# Patient Record
Sex: Female | Born: 1985
Health system: Southern US, Community
[De-identification: ages and names within clinical notes are randomized; demographics above are authoritative.]

## PROBLEM LIST (undated history)

## (undated) DIAGNOSIS — Z789 Other specified health status: Secondary | ICD-10-CM

## (undated) DIAGNOSIS — F419 Anxiety disorder, unspecified: Secondary | ICD-10-CM

## (undated) DIAGNOSIS — C801 Malignant (primary) neoplasm, unspecified: Secondary | ICD-10-CM

## (undated) HISTORY — PX: NO PAST SURGERIES: SHX2092

---

## 2008-10-29 ENCOUNTER — Ambulatory Visit (HOSPITAL_COMMUNITY): Admission: RE | Admit: 2008-10-29 | Discharge: 2008-10-29 | Payer: Self-pay | Admitting: Gynecology

## 2010-01-05 ENCOUNTER — Inpatient Hospital Stay (HOSPITAL_COMMUNITY): Admission: AD | Admit: 2010-01-05 | Discharge: 2010-01-07 | Payer: Self-pay | Admitting: Obstetrics and Gynecology

## 2010-02-22 NOTE — L&D Delivery Note (Cosign Needed)
Delivery Note At 7:12 AM a viable female was delivered via Vaginal, Spontaneous Delivery (Presentation: LOA ;  ).  APGAR: 8, 9; weight 6 lb 5.2 oz (2870 g).   Placenta status: Intact, Spontaneous.  Cord: 3 vessels with the following complications: .  none  Anesthesia: Local  Episiotomy: n/a Lacerations: 2nd degree;Perineal Suture Repair: 2.0 vicryl Est. Blood Loss (mL): 350  Mom to postpartum.  Baby to nursery-stable.  Plans to bottlefeed, unsure of contraception  Joellyn Haff, SNM 01/18/2011, 7:33 AM

## 2010-05-05 LAB — CBC
HCT: 23.8 % — ABNORMAL LOW (ref 36.0–46.0)
HCT: 32.9 % — ABNORMAL LOW (ref 36.0–46.0)
Hemoglobin: 11 g/dL — ABNORMAL LOW (ref 12.0–15.0)
Hemoglobin: 8 g/dL — ABNORMAL LOW (ref 12.0–15.0)
MCH: 26.6 pg (ref 26.0–34.0)
MCH: 26.9 pg (ref 26.0–34.0)
MCHC: 33.5 g/dL (ref 30.0–36.0)
MCHC: 33.7 g/dL (ref 30.0–36.0)
MCV: 79.4 fL (ref 78.0–100.0)
MCV: 79.7 fL (ref 78.0–100.0)
Platelets: 235 10*3/uL (ref 150–400)
Platelets: 285 10*3/uL (ref 150–400)
RBC: 2.99 MIL/uL — ABNORMAL LOW (ref 3.87–5.11)
RBC: 4.15 MIL/uL (ref 3.87–5.11)
RDW: 16.1 % — ABNORMAL HIGH (ref 11.5–15.5)
RDW: 17 % — ABNORMAL HIGH (ref 11.5–15.5)
WBC: 12.4 10*3/uL — ABNORMAL HIGH (ref 4.0–10.5)
WBC: 8.5 10*3/uL (ref 4.0–10.5)

## 2010-05-05 LAB — RPR: RPR Ser Ql: NONREACTIVE

## 2010-07-03 LAB — HIV ANTIBODY (ROUTINE TESTING W REFLEX): HIV: NONREACTIVE

## 2010-07-03 LAB — RUBELLA ANTIBODY, IGM: Rubella: IMMUNE

## 2010-07-03 LAB — ANTIBODY SCREEN: Antibody Screen: NEGATIVE

## 2010-07-03 LAB — RPR: RPR: NONREACTIVE

## 2010-07-03 LAB — HEPATITIS B SURFACE ANTIGEN: Hepatitis B Surface Ag: NEGATIVE

## 2011-01-18 ENCOUNTER — Encounter (HOSPITAL_COMMUNITY): Payer: Self-pay | Admitting: *Deleted

## 2011-01-18 ENCOUNTER — Inpatient Hospital Stay (HOSPITAL_COMMUNITY)
Admission: AD | Admit: 2011-01-18 | Discharge: 2011-01-19 | DRG: 373 | Disposition: A | Discharge status: Home / Self Care | Payer: BC Managed Care – PPO | Source: Ambulatory Visit | Attending: Obstetrics & Gynecology | Admitting: Obstetrics & Gynecology

## 2011-01-18 DIAGNOSIS — O99892 Other specified diseases and conditions complicating childbirth: Secondary | ICD-10-CM | POA: Diagnosis present

## 2011-01-18 DIAGNOSIS — Z22330 Carrier of Group B streptococcus: Secondary | ICD-10-CM

## 2011-01-18 DIAGNOSIS — O0930 Supervision of pregnancy with insufficient antenatal care, unspecified trimester: Secondary | ICD-10-CM

## 2011-01-18 HISTORY — DX: Other specified health status: Z78.9

## 2011-01-18 LAB — RPR: RPR Ser Ql: NONREACTIVE

## 2011-01-18 LAB — CBC
HCT: 33.2 % — ABNORMAL LOW (ref 36.0–46.0)
Hemoglobin: 10.5 g/dL — ABNORMAL LOW (ref 12.0–15.0)
MCH: 24.2 pg — ABNORMAL LOW (ref 26.0–34.0)
MCHC: 31.6 g/dL (ref 30.0–36.0)
MCV: 76.7 fL — ABNORMAL LOW (ref 78.0–100.0)
Platelets: 301 10*3/uL (ref 150–400)
RBC: 4.33 MIL/uL (ref 3.87–5.11)
RDW: 14.1 % (ref 11.5–15.5)
WBC: 10.3 10*3/uL (ref 4.0–10.5)

## 2011-01-18 LAB — ABO/RH: RH Type: POSITIVE

## 2011-01-18 LAB — STREP B DNA PROBE: GBS: POSITIVE

## 2011-01-18 MED ORDER — LANOLIN HYDROUS EX OINT: TOPICAL_OINTMENT | CUTANEOUS | Status: DC | PRN

## 2011-01-18 MED ORDER — WITCH HAZEL-GLYCERIN EX PADS: 1.0000 "application " | MEDICATED_PAD | CUTANEOUS | Status: DC | PRN

## 2011-01-18 MED ORDER — OXYTOCIN BOLUS FROM INFUSION
500.0000 mL | Freq: Once | INTRAVENOUS | Status: DC
  Filled 2011-01-18: qty 500
  Filled 2011-01-18: qty 1000

## 2011-01-18 MED ORDER — LACTATED RINGERS IV SOLN: 500.0000 mL | INTRAVENOUS | Status: DC | PRN

## 2011-01-18 MED ORDER — OXYCODONE-ACETAMINOPHEN 5-325 MG PO TABS
1.0000 | ORAL_TABLET | ORAL | Status: DC | PRN
  Administered 2011-01-18: 1 via ORAL
  Administered 2011-01-18: 2 via ORAL
  Administered 2011-01-18 – 2011-01-19 (×2): 1 via ORAL
  Filled 2011-01-18 (×3): qty 1
  Filled 2011-01-18: qty 2

## 2011-01-18 MED ORDER — CITRIC ACID-SODIUM CITRATE 334-500 MG/5ML PO SOLN: 30.0000 mL | ORAL | Status: DC | PRN

## 2011-01-18 MED ORDER — OXYCODONE-ACETAMINOPHEN 5-325 MG PO TABS: 2.0000 | ORAL_TABLET | ORAL | Status: DC | PRN

## 2011-01-18 MED ORDER — LIDOCAINE HCL (PF) 1 % IJ SOLN
30.0000 mL | INTRAMUSCULAR | Status: DC | PRN
  Administered 2011-01-18: 30 mL via SUBCUTANEOUS
  Filled 2011-01-18: qty 30

## 2011-01-18 MED ORDER — ACETAMINOPHEN 325 MG PO TABS: 650.0000 mg | ORAL_TABLET | ORAL | Status: DC | PRN

## 2011-01-18 MED ORDER — SENNOSIDES-DOCUSATE SODIUM 8.6-50 MG PO TABS
2.0000 | ORAL_TABLET | Freq: Every day | ORAL | Status: DC
  Administered 2011-01-18: 2 via ORAL

## 2011-01-18 MED ORDER — IBUPROFEN 600 MG PO TABS: 600.0000 mg | ORAL_TABLET | Freq: Four times a day (QID) | ORAL | Status: DC | PRN

## 2011-01-18 MED ORDER — BENZOCAINE-MENTHOL 20-0.5 % EX AERO
1.0000 "application " | INHALATION_SPRAY | CUTANEOUS | Status: DC | PRN
  Filled 2011-01-18: qty 56

## 2011-01-18 MED ORDER — DIBUCAINE 1 % RE OINT
1.0000 "application " | TOPICAL_OINTMENT | RECTAL | Status: DC | PRN
  Filled 2011-01-18: qty 28

## 2011-01-18 MED ORDER — LACTATED RINGERS IV SOLN: INTRAVENOUS | Status: DC

## 2011-01-18 MED ORDER — SIMETHICONE 80 MG PO CHEW: 80.0000 mg | CHEWABLE_TABLET | ORAL | Status: DC | PRN

## 2011-01-18 MED ORDER — ONDANSETRON HCL 4 MG/2ML IJ SOLN: 4.0000 mg | INTRAMUSCULAR | Status: DC | PRN

## 2011-01-18 MED ORDER — NALBUPHINE HCL 10 MG/ML IJ SOLN
10.0000 mg | INTRAMUSCULAR | Status: DC | PRN
  Administered 2011-01-18: 10 mg via INTRAVENOUS

## 2011-01-18 MED ORDER — ONDANSETRON HCL 4 MG/2ML IJ SOLN: 4.0000 mg | Freq: Four times a day (QID) | INTRAMUSCULAR | Status: DC | PRN

## 2011-01-18 MED ORDER — OXYTOCIN 20 UNITS IN LACTATED RINGERS INFUSION - SIMPLE
125.0000 mL/h | Freq: Once | INTRAVENOUS | Status: DC
  Administered 2011-01-18: 125 mL/h via INTRAVENOUS

## 2011-01-18 MED ORDER — ONDANSETRON HCL 4 MG PO TABS: 4.0000 mg | ORAL_TABLET | ORAL | Status: DC | PRN

## 2011-01-18 MED ORDER — IBUPROFEN 600 MG PO TABS
600.0000 mg | ORAL_TABLET | Freq: Four times a day (QID) | ORAL | Status: DC
  Administered 2011-01-18 – 2011-01-19 (×6): 600 mg via ORAL
  Filled 2011-01-18 (×6): qty 1

## 2011-01-18 MED ORDER — DIPHENHYDRAMINE HCL 25 MG PO CAPS: 25.0000 mg | ORAL_CAPSULE | Freq: Four times a day (QID) | ORAL | Status: DC | PRN

## 2011-01-18 MED ORDER — TETANUS-DIPHTH-ACELL PERTUSSIS 5-2.5-18.5 LF-MCG/0.5 IM SUSP: 0.5000 mL | Freq: Once | INTRAMUSCULAR | Status: DC

## 2011-01-18 MED ORDER — ZOLPIDEM TARTRATE 5 MG PO TABS: 5.0000 mg | ORAL_TABLET | Freq: Every evening | ORAL | Status: DC | PRN

## 2011-01-18 MED ORDER — PRENATAL PLUS 27-1 MG PO TABS
1.0000 | ORAL_TABLET | Freq: Every day | ORAL | Status: DC
  Administered 2011-01-18 – 2011-01-19 (×2): 1 via ORAL
  Filled 2011-01-18 (×2): qty 1

## 2011-01-18 MED ORDER — NALBUPHINE SYRINGE 5 MG/0.5 ML
INJECTION | INTRAMUSCULAR | Status: AC
  Filled 2011-01-18: qty 1

## 2011-01-18 MED ORDER — INFLUENZA VIRUS VACC SPLIT PF IM SUSP
0.5000 mL | INTRAMUSCULAR | Status: DC
  Filled 2011-01-18: qty 0.5

## 2011-01-18 MED ORDER — FLEET ENEMA 7-19 GM/118ML RE ENEM: 1.0000 | ENEMA | RECTAL | Status: DC | PRN

## 2011-01-18 MED ORDER — SODIUM CHLORIDE 0.9 % IV SOLN
2.0000 g | Freq: Once | INTRAVENOUS | Status: AC
  Administered 2011-01-18: 2 g via INTRAVENOUS
  Filled 2011-01-18: qty 2000

## 2011-01-18 NOTE — Progress Notes (Signed)
Kristin Jones is a 25 y.o. G2P1001 at [redacted]w[redacted]d by stated LMP admitted for active labor  Subjective: Comfortable with dose of nubain, resting, no complaints.  Husband supportive at bedside.  Objective: BP 111/63   Pulse 76   Temp(Src) 98.1 F (36.7 C) (Oral)   Resp 18   Ht 4\' 11"  (1.499 m)   Wt 54.432 kg (120 lb)   BMI 24.24 kg/m2   LMP 04/15/2010      FHT:  FHR: 130 bpm, variability: moderate,  accelerations:  Present,  decelerations:  Present (variable) UC:   regular, every 3-4 minutes x 40-80 seconds SVE:   Dilation: 8.5 Effacement (%): 90 Station: -2 Exam by:: Grace Bushy, Kim  AROM large amount clear fluid  Labs: Lab Results  Component Value Date   WBC 10.3 01/18/2011   HGB 10.5* 01/18/2011   HCT 33.2* 01/18/2011   MCV 76.7* 01/18/2011   PLT 301 01/18/2011    Assessment / Plan: Spontaneous active labor, AROM  Labor: Active labor Preeclampsia:  n/a Fetal Wellbeing:  Category II Pain Control:  Nubain x 1 I/D:  n/a Anticipated MOD:  NSVD  Joellyn Haff, SNM 01/18/2011, 6:51 AM

## 2011-01-18 NOTE — Progress Notes (Signed)
HAVING UC   - STARTED AT [facility]

## 2011-01-18 NOTE — Progress Notes (Signed)
Notified of pt presenting for labor check.  Notified of VE and ctx pattern.  Notified of no prenatal records on pt.  Transfer pt to [employer].  Will place orders.

## 2011-01-18 NOTE — Progress Notes (Signed)
UR chart review completed.  

## 2011-01-18 NOTE — Progress Notes (Signed)
Pt delivered viable female with APGARS 8, 9. Hart Rochester and Grace Bushy present at delivery

## 2011-01-18 NOTE — Progress Notes (Signed)
PT BROUGHT FROM LOBBY WITH W/C-   TO B-ROOM TO VOID.

## 2011-01-18 NOTE — H&P (Signed)
Kristin Jones is a 25 y.o. female G2P1001 at unknown gestation presenting in active labor.  [place] is [place] and speaks [place].  No prenatal records, labs, encounters in Epic from this pregnancy.  Pt has lab slip from [facility] from last week that states she is GBS positive and not PCN allergic.  Had term VAD in 2011 by Dr. Arelia Sneddon, after regular prenatal care. Nurses in process of obtaining [employer] interpreter to ask further questions.  FOB is present and supportive.  Maternal Medical History:  Reason for admission: Reason for admission: contractions.  Contractions: Onset was 6-12 hours ago.   Frequency: regular.   Perceived severity is moderate.    Fetal activity: Perceived fetal activity is normal.   Last perceived fetal movement was within the past hour.    Prenatal complications: Unknown- no prenatal care    OB History    Grav Para Term Preterm Abortions TAB SAB Ect Mult Living   2 1 1       1      Past Medical History  Diagnosis Date   No pertinent past medical history    Past Surgical History  Procedure Date   No past surgeries    Family History: family history is not on file. Social History:  reports that she has never smoked. She does not have any smokeless tobacco history on file. She reports that she does not drink alcohol or use illicit drugs.  Review of Systems  Constitutional: Negative.   HENT: Negative.   Eyes: Negative.   Respiratory: Negative.   Cardiovascular: Negative.   Gastrointestinal: Positive for abdominal pain (UC's).  Genitourinary: Negative.   Musculoskeletal: Negative.   Skin: Negative.   Neurological: Negative.   Endo/Heme/Allergies: Negative.   Psychiatric/Behavioral: Negative.     Dilation: 8.5 Effacement (%): 90 Station: -2 Exam by:: KATE, RN Height 4\' 11"  (1.499 m). Maternal Exam:  Uterine Assessment: Contraction strength is moderate.  Contraction frequency is regular.   Abdomen: Patient reports no abdominal tenderness. Fundal height  is term.   Fetal presentation: vertex  Introitus: Normal vulva. Normal vagina.    Physical Exam  Constitutional: She is oriented to person, place, and time. She appears well-developed and well-nourished.  HENT:  Head: Normocephalic.  Eyes: Pupils are equal, round, and reactive to light.  Neck: Normal range of motion.  Cardiovascular: Normal rate and regular rhythm.   Respiratory: Effort normal.  GI: Soft.  Genitourinary: Vagina normal and uterus normal.  Musculoskeletal: Normal range of motion.  Neurological: She is alert and oriented to person, place, and time. She has normal reflexes.  Skin: Skin is warm and dry.  Psychiatric: She has a normal mood and affect. Her behavior is normal. Judgment and thought content normal.   FHR: Category I, baseline 135, moderate variability, 15x15accels, no decels UCs: q x 80-100, moderate/strong to palpation  SVE: 8-9/90/-2, BBOW, vtx  Prenatal labs: ABO, Rh: A/Positive/-- (11/26 0345) Antibody:   Rubella:   RPR:    HBsAg:    HIV:    GBS: Positive (11/26 0346)   Assessment/Plan: A: IUP at term/unknown gestation     Active labor     Reassuring maternal/fetal status     GBS positive P: Admit to L&D     Ampicillin 2gm IV for GBS positive status/advanced dilation     To determine if true pt of [facility]     Expectant management  Plan of care discussed with Zerita Boers, CNM  Update: LMP 04/15/10, makes EDC 01/21/11, GA: 39.4wks  1st and only prenatal visit was at [facility] last week.  Dr. Gaynell Face (on-call for [facility]) was notified and wants [place] to assume care. No complications/sickness during pregnancy  Joellyn Haff, SNM 01/18/2011, 3:57 AM

## 2011-01-18 NOTE — Progress Notes (Signed)
Notified of pt presenting for labor check.  Notified of VE and no records found on pt.  Pt may go to room 165.

## 2011-01-19 DIAGNOSIS — O0930 Supervision of pregnancy with insufficient antenatal care, unspecified trimester: Secondary | ICD-10-CM

## 2011-01-19 MED ORDER — IBUPROFEN 600 MG PO TABS: 600.0000 mg | ORAL_TABLET | Freq: Four times a day (QID) | ORAL | Status: AC

## 2011-01-19 NOTE — Discharge Summary (Signed)
Obstetric Discharge Summary  Doing well. Wants to go home early today. Bottle feeding since she has a small child at home. Undecided re: contraception  Reason for Admission: onset of labor Prenatal Procedures: NST Intrapartum Procedures: spontaneous vaginal delivery Postpartum Procedures: none Complications-Operative and Postpartum: none Hemoglobin  Date Value Range Status  01/18/2011 10.5* 12.0-15.0 (g/dL) Final     HCT  Date Value Range Status  01/18/2011 33.2* 36.0-46.0 (%) Final    Discharge Diagnoses: Term Pregnancy-delivered                                         Very little Prenatal Care  Discharge Information: Date: 01/19/2011 Activity: unrestricted and pelvic rest Diet: routine Medications: Ibuprofen Condition: stable Instructions: refer to practice specific booklet Discharge to: home Follow-up Information    [facility]. Make an appointment in 6 weeks. [id number])          Someone from the clinic WILL CALL YOU to make an appointment.   Newborn Data: Live born female  Birth Weight: 6 lb 5.2 oz (2870 g) APGAR: 8, 9  Home with mother.  [facility] 01/19/2011, 6:23 AM

## 2011-01-19 NOTE — Progress Notes (Signed)
Pt discharged before Sw could see. Referral reason : [facility]. Sw will follow up with drug screen results and make a referral if needed.  

## 2011-01-20 NOTE — Discharge Summary (Signed)
Attestation of Attending Supervision of Advanced Practitioner: Evaluation and management procedures were performed by the PA/NP/CNM/OB Fellow under my supervision/collaboration. Chart reviewed, and agree with management and plan.  Glenola Wheat, M.D. 01/20/2011 1:37 PM   

## 2011-02-25 ENCOUNTER — Ambulatory Visit (INDEPENDENT_AMBULATORY_CARE_PROVIDER_SITE_OTHER): Payer: BC Managed Care – PPO | Admitting: Family Medicine

## 2011-02-25 ENCOUNTER — Encounter: Payer: Self-pay | Admitting: Family Medicine

## 2011-02-25 DIAGNOSIS — Z3049 Encounter for surveillance of other contraceptives: Secondary | ICD-10-CM

## 2011-02-25 DIAGNOSIS — Z309 Encounter for contraceptive management, unspecified: Secondary | ICD-10-CM

## 2011-02-25 LAB — POCT PREGNANCY, URINE: Preg Test, Ur: NEGATIVE

## 2011-02-25 MED ORDER — MEDROXYPROGESTERONE ACETATE 150 MG/ML IM SUSP
150.0000 mg | INTRAMUSCULAR | Status: DC
  Administered 2011-02-25 – 2011-10-19 (×2): 150 mg via INTRAMUSCULAR

## 2011-02-25 NOTE — Patient Instructions (Signed)
Postpartum Care After Vaginal Delivery  After you deliver your baby, you will stay in the hospital for 24 to 72 hours, unless there were problems with the labor or delivery, or you have medical problems. While you are in the hospital, you will receive help and instructions on how to care for yourself and your baby.  Your doctor will order pain medicine, in case you need it. You will have a small amount of bleeding from your vagina and should change your sanitary pad frequently. Wash your hands thoroughly with soap and water for at least 20 seconds after changing pads and using the toilet. Let the nurses know if you begin to pass blood clots or your bleeding increases. Do not flush blood clots down the toilet before having the nurse look at them, to make sure there is no placental tissue with them.  If you had an intravenous (IV), it will be removed within 24 hours, if there are no problems. The first time you get out of bed or take a shower, call the nurse to help you because you may get weak, lightheaded, or even faint. If you are breastfeeding, you may feel painful contractions of your uterus for a couple of weeks. This is normal. The contractions help your uterus get back to normal size. If you are not breastfeeding, wear a supportive bra and handle your breasts as little as possible until your milk has dried up. Hormones should not be given to dry up the breasts, because they can cause blood clots. You will be given your normal diet, unless you have diabetes or other medical problems.   The nurses may put an ice pack on your episiotomy (surgically enlarged opening), if you have one, to reduce the pain and swelling. On rare occasions, you may not be able to urinate and the nurse will need to empty your bladder with a catheter. If you had a postpartum tubal ligation ("tying tubes," female sterilization), it should not make your stay in the hospital longer.  You may have your baby in your room with you as much as  you like, unless you or the baby has a problem. Use the bassinet (basket) for the baby when going to and from the nursery. Do not carry the baby. Do not leave the postpartum area. If the mother is Rh negative (lacks a protein on the red blood cells) and the baby is Rh positive, the mother should get a Rho-gam shot to prevent Rh problems with future pregnancies.  You may be given written instructions for you and your baby, and necessary medicines, when you are discharged from the hospital. Be sure you understand and follow the instructions as advised.  HOME CARE INSTRUCTIONS   · Follow instructions and take the medicines given to you.   · Only take over-the-counter or prescription medicines for pain, discomfort, or fever as directed by your caregiver.   · Do not take aspirin, because it can cause bleeding.   · Increase your activities a little bit every day to build up your strength and endurance.   · Do not drink alcohol, especially if you are breastfeeding or taking pain medicine.   · Take your temperature twice a day and record it.   · You may have a small amount of bleeding or spotting for 2 to 4 weeks. This is normal.   · Do not use tampons or douche. Use sanitary pads.   · Try to have someone stay and help you for a   few days when you go home.   · Try to rest or take a nap when the baby is sleeping.   · If you are breastfeeding, wear a good support bra. If you are not breastfeeding, wear a supportive bra and do not stimulate your nipples.   · Eat a healthy, nutritious diet and continue to take your prenatal vitamins.   · Do not drive, do any heavy activities, or travel until your caregiver tells you it is okay.   · Do not have intercourse until your caregiver gives you permission to do so.   · Ask your caregiver when you can begin to exercise and what type of exercises to do.   · Call your caregiver if you think you are having a problem from your delivery.   · Call your pediatrician if you are having a problem  with the baby.   · Schedule your postpartum visit and keep it.   SEEK MEDICAL CARE IF:   · You have a temperature of 100° F (37.8° C) or higher.   · You have increased vaginal bleeding or are passing clots. Save any clots to show your caregiver.   · You have bloody urine or pain when you urinate.   · You have a bad smelling vaginal discharge.   · You have increasing pain or swelling on your episiotomy.   · You develop a severe headache.   · You feel depressed.   · The episiotomy is separating.   · You become dizzy or lightheaded.   · You develop a rash.   · You have a reaction or problems with your medicine.   · You have pain, redness, or swelling at the intravenous site.   SEEK IMMEDIATE MEDICAL CARE IF:   · You have chest pain.   · You develop shortness of breath.   · You pass out.   · You develop pain, with or without swelling or redness in your leg.   · You develop heavy vaginal bleeding, with or without blood clots.   · You develop stomach pain.   · You develop a bad smelling vaginal discharge.   MAKE SURE YOU:   · Understand these instructions.   · Will watch your condition.   · Will get help right away if you are not doing well or get worse.   Document Released: 12/06/2006 Document Revised: 10/21/2010 Document Reviewed: 12/18/2008  ExitCare® Patient Information ©2012 [employer].

## 2011-02-25 NOTE — Progress Notes (Signed)
  Subjective:     Kristin Jones is a 26 y.o. female who presents for a postpartum visit. She is 6 weeks postpartum following a spontaneous vaginal delivery. I have fully reviewed the prenatal and intrapartum course. The delivery was at 39 gestational weeks. Outcome: spontaneous vaginal delivery.  Postpartum course has been normal. Baby's course has been normal. Baby is feeding by bottle - Enfamil AR. Bleeding no bleeding. Bowel function is normal. Bladder function is normal. Patient is not sexually active. Contraception method is Depo-Provera injections. Postpartum depression screening: negative.  The following portions of the patient's history were reviewed and updated as appropriate: allergies, current medications, past family history, past medical history, past social history, past surgical history and problem list.  Review of Systems Pertinent items are noted in HPI.   Objective:    BP 110/69  Pulse 72  Temp(Src) 97.3 F (36.3 C) (Oral)  Ht 4' 9.5" (1.461 m)  Wt 93 lb 12.8 oz (42.547 kg)  BMI 19.95 kg/m2  Breastfeeding? No  General:  alert, cooperative and no distress   Breasts:  not inspected  Lungs: clear to auscultation bilaterally  Heart:  regular rate and rhythm, S1, S2 normal, no murmur, click, rub or gallop  Abdomen: soft, non-tender; bowel sounds normal; no masses,  no organomegaly   Vulva:  normal  Vagina: not evaluated  Cervix:  speculum exam not done.  Corpus: normal size, contour, position, consistency, mobility, non-tender  Adnexa:  normal adnexa  Rectal Exam: Not performed.        Assessment:    6 week postpartum exam. Pap smear not done at today's visit.   Plan:    1. Contraception: Depo-Provera injections 2. Follow up in: 3 months for depo-provera or as needed.

## 2011-05-13 ENCOUNTER — Ambulatory Visit (INDEPENDENT_AMBULATORY_CARE_PROVIDER_SITE_OTHER): Payer: BC Managed Care – PPO | Admitting: *Deleted

## 2011-05-13 VITALS — BP 133/80 | Temp 98.1°F | Ht <= 58 in | Wt 93.0 lb

## 2011-05-13 DIAGNOSIS — O0930 Supervision of pregnancy with insufficient antenatal care, unspecified trimester: Secondary | ICD-10-CM

## 2011-05-13 DIAGNOSIS — Z3049 Encounter for surveillance of other contraceptives: Secondary | ICD-10-CM

## 2011-05-13 MED ORDER — MEDROXYPROGESTERONE ACETATE 150 MG/ML IM SUSP
150.0000 mg | Freq: Once | INTRAMUSCULAR | Status: AC
  Administered 2011-05-13: 150 mg via INTRAMUSCULAR

## 2011-05-14 ENCOUNTER — Encounter: Payer: Self-pay | Admitting: *Deleted

## 2011-05-14 DIAGNOSIS — Z3049 Encounter for surveillance of other contraceptives: Secondary | ICD-10-CM | POA: Insufficient documentation

## 2011-08-03 ENCOUNTER — Ambulatory Visit (INDEPENDENT_AMBULATORY_CARE_PROVIDER_SITE_OTHER): Payer: BC Managed Care – PPO | Admitting: General Practice

## 2011-08-03 VITALS — BP 113/71 | HR 77 | Ht 59.0 in | Wt 96.0 lb

## 2011-08-03 DIAGNOSIS — Z309 Encounter for contraceptive management, unspecified: Secondary | ICD-10-CM

## 2011-08-03 MED ORDER — MEDROXYPROGESTERONE ACETATE 150 MG/ML IM SUSP
150.0000 mg | Freq: Once | INTRAMUSCULAR | Status: AC
  Administered 2011-08-03: 150 mg via INTRAMUSCULAR

## 2011-10-19 ENCOUNTER — Ambulatory Visit (INDEPENDENT_AMBULATORY_CARE_PROVIDER_SITE_OTHER): Payer: BC Managed Care – PPO | Admitting: Medical

## 2011-10-19 VITALS — BP 109/76 | HR 83 | Resp 12

## 2011-10-19 DIAGNOSIS — Z3049 Encounter for surveillance of other contraceptives: Secondary | ICD-10-CM

## 2012-02-03 ENCOUNTER — Ambulatory Visit (INDEPENDENT_AMBULATORY_CARE_PROVIDER_SITE_OTHER): Payer: BC Managed Care – PPO

## 2012-02-03 VITALS — BP 115/82 | HR 76

## 2012-02-03 DIAGNOSIS — N390 Urinary tract infection, site not specified: Secondary | ICD-10-CM

## 2012-02-03 DIAGNOSIS — Z3200 Encounter for pregnancy test, result unknown: Secondary | ICD-10-CM

## 2012-02-03 DIAGNOSIS — Z3049 Encounter for surveillance of other contraceptives: Secondary | ICD-10-CM

## 2012-02-03 LAB — POCT URINALYSIS DIP (DEVICE)
Bilirubin Urine: NEGATIVE
Glucose, UA: NEGATIVE mg/dL
Hgb urine dipstick: NEGATIVE
Ketones, ur: NEGATIVE mg/dL
Leukocytes, UA: NEGATIVE
Nitrite: NEGATIVE
Protein, ur: NEGATIVE mg/dL
Specific Gravity, Urine: 1.01 (ref 1.005–1.030)
Urobilinogen, UA: 0.2 mg/dL (ref 0.0–1.0)
pH: 6 (ref 5.0–8.0)

## 2012-02-03 LAB — POCT PREGNANCY, URINE: Preg Test, Ur: NEGATIVE

## 2012-02-03 MED ORDER — MEDROXYPROGESTERONE ACETATE 150 MG/ML IM SUSP
150.0000 mg | Freq: Once | INTRAMUSCULAR | Status: AC
  Administered 2012-02-03: 150 mg via INTRAMUSCULAR

## 2012-02-03 NOTE — Progress Notes (Signed)
[employer] interpreters # [phone]

## 2012-04-20 ENCOUNTER — Ambulatory Visit (INDEPENDENT_AMBULATORY_CARE_PROVIDER_SITE_OTHER): Payer: Self-pay | Admitting: Family

## 2012-04-20 ENCOUNTER — Ambulatory Visit: Payer: Self-pay

## 2012-04-20 DIAGNOSIS — Z3009 Encounter for other general counseling and advice on contraception: Secondary | ICD-10-CM

## 2012-04-20 MED ORDER — NORGESTIMATE-ETH ESTRADIOL 0.25-35 MG-MCG PO TABS: 1.0000 | ORAL_TABLET | Freq: Every day | ORAL | Status: DC

## 2012-04-20 NOTE — Progress Notes (Signed)
Pt here for change of contraception.  Pt wants to have BCP instead of Depo Provera due injection "make her feel funny".  Per Dr. Shawnie Pons pt does not have hx of HTN, DVT, or currently breastfeeding pt can have BCP's with refills for one year.  Educated pt on how to take BCP once a day at the same time, if missed it increases chances of becoming pregnant.  Pt stated understanding and did not have any further questions via interpreter.

## 2013-12-24 ENCOUNTER — Encounter: Payer: Self-pay | Admitting: Family Medicine

## 2014-01-16 ENCOUNTER — Emergency Department (HOSPITAL_COMMUNITY)
Admission: EM | Admit: 2014-01-16 | Discharge: 2014-01-16 | Disposition: A | Discharge status: Home / Self Care | Payer: Self-pay | Source: Home / Self Care

## 2014-02-18 ENCOUNTER — Ambulatory Visit
Admission: RE | Admit: 2014-02-18 | Discharge: 2014-02-18 | Disposition: A | Discharge status: Home / Self Care | Payer: No Typology Code available for payment source | Source: Ambulatory Visit | Attending: Infectious Disease | Admitting: Infectious Disease

## 2014-02-18 ENCOUNTER — Other Ambulatory Visit: Payer: Self-pay | Admitting: Infectious Disease

## 2014-02-18 DIAGNOSIS — Z139 Encounter for screening, unspecified: Secondary | ICD-10-CM

## 2014-11-06 ENCOUNTER — Encounter: Payer: Self-pay | Admitting: Family Medicine

## 2014-11-06 ENCOUNTER — Ambulatory Visit (INDEPENDENT_AMBULATORY_CARE_PROVIDER_SITE_OTHER): Payer: BLUE CROSS/BLUE SHIELD | Admitting: Family Medicine

## 2014-11-06 ENCOUNTER — Encounter: Payer: Self-pay | Admitting: *Deleted

## 2014-11-06 VITALS — BP 117/78 | HR 72 | Temp 97.4°F | Resp 20 | Ht <= 58 in | Wt 86.9 lb

## 2014-11-06 DIAGNOSIS — Z124 Encounter for screening for malignant neoplasm of cervix: Secondary | ICD-10-CM | POA: Diagnosis not present

## 2014-11-06 DIAGNOSIS — Z3169 Encounter for other general counseling and advice on procreation: Secondary | ICD-10-CM | POA: Diagnosis not present

## 2014-11-06 DIAGNOSIS — N979 Female infertility, unspecified: Secondary | ICD-10-CM

## 2014-11-06 DIAGNOSIS — Z01419 Encounter for gynecological examination (general) (routine) without abnormal findings: Secondary | ICD-10-CM

## 2014-11-06 NOTE — Progress Notes (Signed)
Subjective:     Patient ID: Kristin Jones, female   DOB: Jan 11, 1986, 29 y.o.   MRN: [id number]  HPI Comments: [id number] presenting for evaluation for infertility. Patient has been trying to concieve for 2 years. She was previous on depoprovera for 1 yr then OCP for 1 year. She has been off of all contraceptives for 2 years but has not conceived. She reports regular monthly period every 30 days that is 4-5 days long with heaviest days on days 1-3. She reports no trouble conceiving previous two children, youngest is 4.  She reports she has the same partner. She reports her typical weight is 96lbs but denies intentionally trying to lose weight. She is not currently breastfeeding. She has no known thyroid or endocrine dysfunction.   Reviewed and updated medical history, surgical history, family history, social hx and allergies.   Review of Systems  Constitutional: Negative for fever and chills.  HENT: Negative for congestion and sore throat.   Eyes: Negative for pain and visual disturbance.  Respiratory: Negative for cough, chest tightness and shortness of breath.   Cardiovascular: Negative for chest pain.  Gastrointestinal: Negative for nausea, vomiting, abdominal pain and diarrhea.  Endocrine: Negative for cold intolerance and heat intolerance.  Genitourinary: Negative for dysuria and flank pain.  Musculoskeletal: Negative for back pain.  Skin: Negative for rash.  Allergic/Immunologic: Negative for food allergies.  Neurological: Negative for dizziness and light-headedness.  Psychiatric/Behavioral: Negative for agitation.       Objective:   Physical Exam  Constitutional: She is oriented to person, place, and time. She appears well-developed and well-nourished.  HENT:  Head: Normocephalic and atraumatic.  Eyes: Conjunctivae are normal.  Neck: Normal range of motion. Neck supple. No thyromegaly (Has 1 cm nodule in right lobe of thyroid, located superiorly. ) present.  Cardiovascular: Normal rate  and intact distal pulses.   Pulmonary/Chest: Effort normal.  Abdominal: Soft. There is no tenderness.  Genitourinary: Vagina normal and uterus normal. Cervix exhibits no motion tenderness and no discharge. Right adnexum displays no tenderness. Left adnexum displays no tenderness.  Multiparous cervix  Musculoskeletal: Normal range of motion.  Neurological: She is alert and oriented to person, place, and time.  Skin: Skin is warm and dry. No erythema.  Psychiatric: She has a normal mood and affect.  Nursing note and vitals reviewed.      Assessment:     Kristin Jones is a 29 y.o. [id number] presenting with infertility      Plan:   #Infertility: Patient has been proven to be able to to get pregnant x2 with current partner.  - TSH given weight loss as hyperthyroidism would cause infertility though her regular period suggest her HPA axis is intact. -HA1C to r/o DM as cause of infertility.  - AMH to assess ovarian reserve.  -Referral to [facility]. Recommend semen analysis.

## 2014-11-06 NOTE — Progress Notes (Signed)
Pt states she is here because she has been trying to conceive and cannot. She was taking OCP's for 1 year and has not taken for 2 years. Prior to that she was on Depo Provera for 1 year.  She reports last Pap done 2 years ago @ this clinic. Per chart review, no Pap done in medical history going back to 2011.

## 2014-11-07 ENCOUNTER — Other Ambulatory Visit: Payer: BLUE CROSS/BLUE SHIELD

## 2014-11-07 ENCOUNTER — Encounter: Payer: Self-pay | Admitting: Obstetrics & Gynecology

## 2014-11-07 DIAGNOSIS — Z3169 Encounter for other general counseling and advice on procreation: Secondary | ICD-10-CM

## 2014-11-07 LAB — TSH: TSH: 0.5 u[IU]/mL (ref 0.350–4.500)

## 2014-11-07 LAB — HEMOGLOBIN A1C
Hgb A1c MFr Bld: 5.7 % — ABNORMAL HIGH (ref <5.7)
Mean Plasma Glucose: 117 mg/dL — ABNORMAL HIGH (ref <117)

## 2014-11-07 LAB — CYTOLOGY - PAP

## 2014-11-11 LAB — ANTI MULLERIAN HORMONE: AMH AssessR: 5.83 ng/mL

## 2014-11-12 ENCOUNTER — Encounter: Payer: Self-pay | Admitting: General Practice

## 2014-12-02 ENCOUNTER — Telehealth: Payer: Self-pay | Admitting: *Deleted

## 2014-12-02 NOTE — Telephone Encounter (Signed)
Kristin Jones called and left a message stating she wants to know if there is a  Medicine we can prescribe so she can get pregnant. States she has 2 children, but hard to get pregnant. Wants a call to discuss this.

## 2014-12-03 NOTE — Telephone Encounter (Signed)
Called patient and message stated this person is not accepting calls at this time

## 2014-12-05 NOTE — Telephone Encounter (Signed)
Called patient and heard message that patient is not accepting calls. Will await patient to call us back.
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
## 2017-11-21 DIAGNOSIS — B373 Candidiasis of vulva and vagina: Secondary | ICD-10-CM | POA: Diagnosis not present

## 2017-11-21 DIAGNOSIS — Z3202 Encounter for pregnancy test, result negative: Secondary | ICD-10-CM | POA: Diagnosis not present

## 2018-01-23 DIAGNOSIS — N911 Secondary amenorrhea: Secondary | ICD-10-CM | POA: Diagnosis not present

## 2018-01-30 DIAGNOSIS — Z3480 Encounter for supervision of other normal pregnancy, unspecified trimester: Secondary | ICD-10-CM | POA: Diagnosis not present

## 2018-01-30 DIAGNOSIS — Z3A11 11 weeks gestation of pregnancy: Secondary | ICD-10-CM | POA: Diagnosis not present

## 2018-01-30 DIAGNOSIS — Z3685 Encounter for antenatal screening for Streptococcus B: Secondary | ICD-10-CM | POA: Diagnosis not present

## 2018-01-30 DIAGNOSIS — Z3481 Encounter for supervision of other normal pregnancy, first trimester: Secondary | ICD-10-CM | POA: Diagnosis not present

## 2018-01-30 LAB — OB RESULTS CONSOLE HIV ANTIBODY (ROUTINE TESTING): HIV: NONREACTIVE

## 2018-01-30 LAB — OB RESULTS CONSOLE HEPATITIS B SURFACE ANTIGEN: Hepatitis B Surface Ag: NEGATIVE

## 2018-01-30 LAB — OB RESULTS CONSOLE GC/CHLAMYDIA
Chlamydia: NEGATIVE
Gonorrhea: NEGATIVE

## 2018-01-30 LAB — OB RESULTS CONSOLE ABO/RH: RH Type: POSITIVE

## 2018-01-30 LAB — OB RESULTS CONSOLE ANTIBODY SCREEN: Antibody Screen: NEGATIVE

## 2018-01-30 LAB — OB RESULTS CONSOLE RPR: RPR: NONREACTIVE

## 2018-02-06 DIAGNOSIS — Z113 Encounter for screening for infections with a predominantly sexual mode of transmission: Secondary | ICD-10-CM | POA: Diagnosis not present

## 2018-02-06 DIAGNOSIS — Z3491 Encounter for supervision of normal pregnancy, unspecified, first trimester: Secondary | ICD-10-CM | POA: Diagnosis not present

## 2018-02-08 DIAGNOSIS — Z3401 Encounter for supervision of normal first pregnancy, first trimester: Secondary | ICD-10-CM | POA: Diagnosis not present

## 2018-02-08 DIAGNOSIS — Z3A12 12 weeks gestation of pregnancy: Secondary | ICD-10-CM | POA: Diagnosis not present

## 2018-02-08 DIAGNOSIS — Z3682 Encounter for antenatal screening for nuchal translucency: Secondary | ICD-10-CM | POA: Diagnosis not present

## 2018-02-22 NOTE — L&D Delivery Note (Signed)
Delivery Note At 10:35 AM a viable female was delivered via Vaginal, Spontaneous (Presentation: LOA).  APGAR: 8, 9; weight pending.   Placenta status: S, I. 3V Cord with the following complications: none.  Cord pH: n/a  Anesthesia:  CLEA Episiotomy: None Lacerations: 2nd degree  Suture Repair: 3.0 vicryl rapide Est. Blood Loss (mL):  100  Mom to postpartum.  Baby to Couplet care / Skin to Skin.  Linda Hedges 08/22/2018, 10:53 AM

## 2018-03-30 DIAGNOSIS — Z3A19 19 weeks gestation of pregnancy: Secondary | ICD-10-CM | POA: Diagnosis not present

## 2018-03-30 DIAGNOSIS — Z363 Encounter for antenatal screening for malformations: Secondary | ICD-10-CM | POA: Diagnosis not present

## 2018-03-30 DIAGNOSIS — Z361 Encounter for antenatal screening for raised alphafetoprotein level: Secondary | ICD-10-CM | POA: Diagnosis not present

## 2018-05-22 DIAGNOSIS — Z3480 Encounter for supervision of other normal pregnancy, unspecified trimester: Secondary | ICD-10-CM | POA: Diagnosis not present

## 2018-06-08 DIAGNOSIS — D509 Iron deficiency anemia, unspecified: Secondary | ICD-10-CM | POA: Diagnosis not present

## 2018-07-18 DIAGNOSIS — Z369 Encounter for antenatal screening, unspecified: Secondary | ICD-10-CM | POA: Diagnosis not present

## 2018-07-18 DIAGNOSIS — Z3685 Encounter for antenatal screening for Streptococcus B: Secondary | ICD-10-CM | POA: Diagnosis not present

## 2018-08-03 LAB — OB RESULTS CONSOLE GBS: GBS: NEGATIVE

## 2018-08-14 ENCOUNTER — Inpatient Hospital Stay (HOSPITAL_COMMUNITY)
Admission: AD | Admit: 2018-08-14 | Discharge: 2018-08-15 | Disposition: A | Discharge status: Home / Self Care | Payer: BC Managed Care – PPO | Attending: Obstetrics and Gynecology | Admitting: Obstetrics and Gynecology

## 2018-08-14 ENCOUNTER — Other Ambulatory Visit: Payer: Self-pay

## 2018-08-14 ENCOUNTER — Encounter (HOSPITAL_COMMUNITY): Payer: Self-pay

## 2018-08-14 DIAGNOSIS — O479 False labor, unspecified: Secondary | ICD-10-CM

## 2018-08-14 DIAGNOSIS — Z3A39 39 weeks gestation of pregnancy: Secondary | ICD-10-CM | POA: Diagnosis not present

## 2018-08-14 DIAGNOSIS — O471 False labor at or after 37 completed weeks of gestation: Secondary | ICD-10-CM | POA: Insufficient documentation

## 2018-08-14 HISTORY — DX: Other specified health status: Z78.9

## 2018-08-14 NOTE — MAU Note (Signed)
Pt presents to MAU c/o contractions which started at 1700, 5 mins apart, Denies LOF or bleeding. +FM

## 2018-08-14 NOTE — MAU Provider Note (Signed)
None    S: Ms. Kristin Jones is a 33 y.o. G3P2002 at [redacted]w[redacted]d  who presents to MAU today complaining contractions q 5 minutes since 1700. She denies vaginal bleeding. She denies LOF. She reports normal fetal movement.    O: BP 115/72 (BP Location: Right Arm)   Pulse 86   Temp (!) 97.4 F (36.3 C) (Oral)   Resp 18   Ht 4\' 9"  (1.448 m)   Wt 53.8 kg   SpO2 99% Comment: room air  BMI 25.66 kg/m  GENERAL: Well-developed, well-nourished female in no acute distress.  HEAD: Normocephalic, atraumatic.  CHEST: Normal effort of breathing, regular heart rate ABDOMEN: Soft, nontender, gravid  Cervical exam:  Dilation: 2.5 Effacement (%): 50 Cervical Position: Posterior Station: Ballotable Presentation: Vertex Exam by:: Frances Maywood RN    Fetal Monitoring: Baseline: 145 Variability: Mod Accelerations: Pos 15 x 15 Decelerations: None Contractions: Irregular q 6-10 min   A: SIUP at [redacted]w[redacted]d  False labor, no cervical change in 1 hour of evaluation in MAU  P: Discharge home in stable condition with labor precautions Language barrier: iPad interpreter used by RN for all patient interaction  [name], CNM 08/15/2018 1:05 AM

## 2018-08-14 NOTE — Discharge Instructions (Signed)
Braxton Hicks Contractions Contractions of the uterus can occur throughout pregnancy, but they are not always a sign that you are in labor. You may have practice contractions called Braxton Hicks contractions. These false labor contractions are sometimes confused with true labor. What are Braxton Hicks contractions? Braxton Hicks contractions are tightening movements that occur in the muscles of the uterus before labor. Unlike true labor contractions, these contractions do not result in opening (dilation) and thinning of the cervix. Toward the end of pregnancy (32-34 weeks), Braxton Hicks contractions can happen more often and may become stronger. These contractions are sometimes difficult to tell apart from true labor because they can be very uncomfortable. You should not feel embarrassed if you go to the hospital with false labor. Sometimes, the only way to tell if you are in true labor is for your health care provider to look for changes in the cervix. The health care provider will do a physical exam and may monitor your contractions. If you are not in true labor, the exam should show that your cervix is not dilating and your water has not broken. If there are no other health problems associated with your pregnancy, it is completely safe for you to be sent home with false labor. You may continue to have Braxton Hicks contractions until you go into true labor. How to tell the difference between true labor and false labor True labor  Contractions last 30-70 seconds.  Contractions become very regular.  Discomfort is usually felt in the top of the uterus, and it spreads to the lower abdomen and low back.  Contractions do not go away with walking.  Contractions usually become more intense and increase in frequency.  The cervix dilates and gets thinner. False labor  Contractions are usually shorter and not as strong as true labor contractions.  Contractions are usually irregular.  Contractions  are often felt in the front of the lower abdomen and in the groin.  Contractions may go away when you walk around or change positions while lying down.  Contractions get weaker and are shorter-lasting as time goes on.  The cervix usually does not dilate or become thin. Follow these instructions at home:   Take over-the-counter and prescription medicines only as told by your health care provider.  Keep up with your usual exercises and follow other instructions from your health care provider.  Eat and drink lightly if you think you are going into labor.  If Braxton Hicks contractions are making you uncomfortable: ? Change your position from lying down or resting to walking, or change from walking to resting. ? Sit and rest in a tub of warm water. ? Drink enough fluid to keep your urine pale yellow. Dehydration may cause these contractions. ? Do slow and deep breathing several times an hour.  Keep all follow-up prenatal visits as told by your health care provider. This is important. Contact a health care provider if:  You have a fever.  You have continuous pain in your abdomen. Get help right away if:  Your contractions become stronger, more regular, and closer together.  You have fluid leaking or gushing from your vagina.  You pass blood-tinged mucus (bloody show).  You have bleeding from your vagina.  You have low back pain that you never had before.  You feel your baby's head pushing down and causing pelvic pressure.  Your baby is not moving inside you as much as it used to. Summary  Contractions that occur before labor are   called Braxton Hicks contractions, false labor, or practice contractions.  Braxton Hicks contractions are usually shorter, weaker, farther apart, and less regular than true labor contractions. True labor contractions usually become progressively stronger and regular, and they become more frequent.  Manage discomfort from Braxton Hicks contractions  by changing position, resting in a warm bath, drinking plenty of water, or practicing deep breathing. This information is not intended to replace advice given to you by your health care provider. Make sure you discuss any questions you have with your health care provider. Document Released: 06/24/2016 Document Revised: 11/23/2016 Document Reviewed: 06/24/2016 Elsevier Interactive Patient Education  2019 Elsevier Inc.  

## 2018-08-15 ENCOUNTER — Telehealth (HOSPITAL_COMMUNITY): Payer: Self-pay

## 2018-08-15 DIAGNOSIS — O471 False labor at or after 37 completed weeks of gestation: Secondary | ICD-10-CM | POA: Diagnosis not present

## 2018-08-15 DIAGNOSIS — Z3A39 39 weeks gestation of pregnancy: Secondary | ICD-10-CM | POA: Diagnosis not present

## 2018-08-15 NOTE — MAU Note (Signed)
I have communicated with Maryelizabeth Kaufmann, CNM  and reviewed vital signs:  Vitals:   08/14/18 2154 08/14/18 2355  BP: 115/72 100/68  Pulse: 86   Resp: 18   Temp: (!) 97.4 F (36.3 C)   SpO2: 99%     Vaginal exam:  Dilation: 2.5 Effacement (%): 50 Cervical Position: Posterior Station: Ballotable Presentation: Vertex Exam by:: Frances Maywood RN ,   Also reviewed contraction pattern and that non-stress test is reactive.  It has been documented that patient is contracting every irregularly minutes with no cervical change over 1.25 hours not indicating active labor.  Patient denies any other complaints.  Based on this report provider has given order for discharge.  A discharge order and diagnosis entered by a provider.   Labor discharge instructions reviewed with patient.

## 2018-08-15 NOTE — Telephone Encounter (Signed)
Preadmission screen Interpreter number  

## 2018-08-18 ENCOUNTER — Other Ambulatory Visit (HOSPITAL_COMMUNITY)
Admission: RE | Admit: 2018-08-18 | Discharge: 2018-08-18 | Disposition: A | Discharge status: Home / Self Care | Payer: BC Managed Care – PPO | Source: Ambulatory Visit

## 2018-08-22 ENCOUNTER — Inpatient Hospital Stay (HOSPITAL_COMMUNITY): Payer: BC Managed Care – PPO

## 2018-08-22 ENCOUNTER — Encounter (HOSPITAL_COMMUNITY): Payer: Self-pay | Admitting: *Deleted

## 2018-08-22 ENCOUNTER — Inpatient Hospital Stay (HOSPITAL_COMMUNITY)
Admission: AD | Admit: 2018-08-22 | Discharge: 2018-08-22 | Discharge status: Absent without leave | Payer: BC Managed Care – PPO | Attending: Obstetrics and Gynecology | Admitting: Obstetrics and Gynecology

## 2018-08-22 ENCOUNTER — Inpatient Hospital Stay (HOSPITAL_COMMUNITY)
Admission: AD | Admit: 2018-08-22 | Discharge: 2018-08-23 | DRG: 807 | Disposition: A | Discharge status: Home / Self Care | Payer: BC Managed Care – PPO | Source: Intra-hospital | Attending: Obstetrics & Gynecology | Admitting: Obstetrics & Gynecology

## 2018-08-22 ENCOUNTER — Other Ambulatory Visit: Payer: Self-pay

## 2018-08-22 ENCOUNTER — Inpatient Hospital Stay (HOSPITAL_COMMUNITY): Payer: BC Managed Care – PPO | Admitting: Anesthesiology

## 2018-08-22 DIAGNOSIS — Z3A39 39 weeks gestation of pregnancy: Secondary | ICD-10-CM | POA: Diagnosis not present

## 2018-08-22 DIAGNOSIS — Z3A40 40 weeks gestation of pregnancy: Secondary | ICD-10-CM | POA: Diagnosis not present

## 2018-08-22 DIAGNOSIS — Z3490 Encounter for supervision of normal pregnancy, unspecified, unspecified trimester: Secondary | ICD-10-CM

## 2018-08-22 DIAGNOSIS — O26893 Other specified pregnancy related conditions, third trimester: Secondary | ICD-10-CM | POA: Diagnosis not present

## 2018-08-22 DIAGNOSIS — Z1159 Encounter for screening for other viral diseases: Secondary | ICD-10-CM | POA: Diagnosis not present

## 2018-08-22 DIAGNOSIS — O480 Post-term pregnancy: Secondary | ICD-10-CM | POA: Diagnosis not present

## 2018-08-22 LAB — CBC
HCT: 40.8 % (ref 36.0–46.0)
Hemoglobin: 13.9 g/dL (ref 12.0–15.0)
MCH: 30.5 pg (ref 26.0–34.0)
MCHC: 34.1 g/dL (ref 30.0–36.0)
MCV: 89.7 fL (ref 80.0–100.0)
Platelets: 267 10*3/uL (ref 150–400)
RBC: 4.55 MIL/uL (ref 3.87–5.11)
RDW: 13.3 % (ref 11.5–15.5)
WBC: 13.5 10*3/uL — ABNORMAL HIGH (ref 4.0–10.5)
nRBC: 0 % (ref 0.0–0.2)

## 2018-08-22 LAB — SARS CORONAVIRUS 2 BY RT PCR (HOSPITAL ORDER, PERFORMED IN ~~LOC~~ HOSPITAL LAB): SARS Coronavirus 2: NEGATIVE

## 2018-08-22 LAB — RPR: RPR Ser Ql: NONREACTIVE

## 2018-08-22 LAB — TYPE AND SCREEN
ABO/RH(D): A POS
Antibody Screen: NEGATIVE

## 2018-08-22 MED ORDER — TETANUS-DIPHTH-ACELL PERTUSSIS 5-2.5-18.5 LF-MCG/0.5 IM SUSP: 0.5000 mL | Freq: Once | INTRAMUSCULAR | Status: DC

## 2018-08-22 MED ORDER — LIDOCAINE HCL (PF) 1 % IJ SOLN
INTRAMUSCULAR | Status: DC | PRN
  Administered 2018-08-22 (×2): 5 mL via EPIDURAL

## 2018-08-22 MED ORDER — OXYCODONE-ACETAMINOPHEN 5-325 MG PO TABS
1.0000 | ORAL_TABLET | ORAL | Status: DC | PRN
  Administered 2018-08-23: 1 via ORAL
  Filled 2018-08-22: qty 1

## 2018-08-22 MED ORDER — FENTANYL-BUPIVACAINE-NACL 0.5-0.125-0.9 MG/250ML-% EP SOLN
12.0000 mL/h | EPIDURAL | Status: DC | PRN
  Administered 2018-08-22: 12 mL/h via EPIDURAL
  Filled 2018-08-22: qty 250

## 2018-08-22 MED ORDER — DIBUCAINE (PERIANAL) 1 % EX OINT: 1.0000 "application " | TOPICAL_OINTMENT | CUTANEOUS | Status: DC | PRN

## 2018-08-22 MED ORDER — OXYTOCIN BOLUS FROM INFUSION
500.0000 mL | Freq: Once | INTRAVENOUS | Status: AC
  Administered 2018-08-22: 500 mL via INTRAVENOUS

## 2018-08-22 MED ORDER — OXYTOCIN 40 UNITS IN NORMAL SALINE INFUSION - SIMPLE MED
2.5000 [IU]/h | INTRAVENOUS | Status: DC
  Filled 2018-08-22: qty 1000

## 2018-08-22 MED ORDER — PHENYLEPHRINE 40 MCG/ML (10ML) SYRINGE FOR IV PUSH (FOR BLOOD PRESSURE SUPPORT)
80.0000 ug | PREFILLED_SYRINGE | INTRAVENOUS | Status: DC | PRN
  Filled 2018-08-22: qty 10

## 2018-08-22 MED ORDER — IBUPROFEN 600 MG PO TABS
600.0000 mg | ORAL_TABLET | Freq: Four times a day (QID) | ORAL | Status: DC
  Administered 2018-08-22 – 2018-08-23 (×4): 600 mg via ORAL
  Filled 2018-08-22 (×4): qty 1

## 2018-08-22 MED ORDER — TERBUTALINE SULFATE 1 MG/ML IJ SOLN: 0.2500 mg | Freq: Once | INTRAMUSCULAR | Status: DC | PRN

## 2018-08-22 MED ORDER — LACTATED RINGERS IV SOLN
INTRAVENOUS | Status: DC
  Administered 2018-08-22 (×2): via INTRAVENOUS

## 2018-08-22 MED ORDER — SODIUM CHLORIDE (PF) 0.9 % IJ SOLN
INTRAMUSCULAR | Status: DC | PRN
  Administered 2018-08-22: 12 mL/h via EPIDURAL

## 2018-08-22 MED ORDER — WITCH HAZEL-GLYCERIN EX PADS: 1.0000 "application " | MEDICATED_PAD | CUTANEOUS | Status: DC | PRN

## 2018-08-22 MED ORDER — EPHEDRINE 5 MG/ML INJ: 10.0000 mg | INTRAVENOUS | Status: DC | PRN

## 2018-08-22 MED ORDER — ONDANSETRON HCL 4 MG PO TABS: 4.0000 mg | ORAL_TABLET | ORAL | Status: DC | PRN

## 2018-08-22 MED ORDER — OXYCODONE-ACETAMINOPHEN 5-325 MG PO TABS: 2.0000 | ORAL_TABLET | ORAL | Status: DC | PRN

## 2018-08-22 MED ORDER — ONDANSETRON HCL 4 MG/2ML IJ SOLN: 4.0000 mg | Freq: Four times a day (QID) | INTRAMUSCULAR | Status: DC | PRN

## 2018-08-22 MED ORDER — ACETAMINOPHEN 325 MG PO TABS
650.0000 mg | ORAL_TABLET | ORAL | Status: DC | PRN
  Administered 2018-08-22 – 2018-08-23 (×3): 650 mg via ORAL
  Filled 2018-08-22 (×3): qty 2

## 2018-08-22 MED ORDER — ONDANSETRON HCL 4 MG/2ML IJ SOLN: 4.0000 mg | INTRAMUSCULAR | Status: DC | PRN

## 2018-08-22 MED ORDER — FENTANYL-BUPIVACAINE-NACL 0.5-0.125-0.9 MG/250ML-% EP SOLN: 12.0000 mL/h | EPIDURAL | Status: DC | PRN

## 2018-08-22 MED ORDER — SOD CITRATE-CITRIC ACID 500-334 MG/5ML PO SOLN: 30.0000 mL | ORAL | Status: DC | PRN

## 2018-08-22 MED ORDER — LACTATED RINGERS IV SOLN
500.0000 mL | INTRAVENOUS | Status: DC | PRN
  Administered 2018-08-22: 500 mL via INTRAVENOUS

## 2018-08-22 MED ORDER — LACTATED RINGERS IV SOLN: 500.0000 mL | Freq: Once | INTRAVENOUS | Status: DC

## 2018-08-22 MED ORDER — FENTANYL CITRATE (PF) 100 MCG/2ML IJ SOLN: 50.0000 ug | INTRAMUSCULAR | Status: DC | PRN

## 2018-08-22 MED ORDER — SENNOSIDES-DOCUSATE SODIUM 8.6-50 MG PO TABS
2.0000 | ORAL_TABLET | ORAL | Status: DC
  Administered 2018-08-23: 2 via ORAL
  Filled 2018-08-22: qty 2

## 2018-08-22 MED ORDER — COCONUT OIL OIL: 1.0000 "application " | TOPICAL_OIL | Status: DC | PRN

## 2018-08-22 MED ORDER — OXYTOCIN 40 UNITS IN NORMAL SALINE INFUSION - SIMPLE MED
1.0000 m[IU]/min | INTRAVENOUS | Status: DC
  Administered 2018-08-22: 2 m[IU]/min via INTRAVENOUS

## 2018-08-22 MED ORDER — SIMETHICONE 80 MG PO CHEW: 80.0000 mg | CHEWABLE_TABLET | ORAL | Status: DC | PRN

## 2018-08-22 MED ORDER — LIDOCAINE HCL (PF) 1 % IJ SOLN: 30.0000 mL | INTRAMUSCULAR | Status: DC | PRN

## 2018-08-22 MED ORDER — FLEET ENEMA 7-19 GM/118ML RE ENEM: 1.0000 | ENEMA | RECTAL | Status: DC | PRN

## 2018-08-22 MED ORDER — ZOLPIDEM TARTRATE 5 MG PO TABS: 5.0000 mg | ORAL_TABLET | Freq: Every evening | ORAL | Status: DC | PRN

## 2018-08-22 MED ORDER — BENZOCAINE-MENTHOL 20-0.5 % EX AERO: 1.0000 "application " | INHALATION_SPRAY | CUTANEOUS | Status: DC | PRN

## 2018-08-22 MED ORDER — DIPHENHYDRAMINE HCL 25 MG PO CAPS: 25.0000 mg | ORAL_CAPSULE | Freq: Four times a day (QID) | ORAL | Status: DC | PRN

## 2018-08-22 MED ORDER — PRENATAL MULTIVITAMIN CH
1.0000 | ORAL_TABLET | Freq: Every day | ORAL | Status: DC
  Administered 2018-08-22 – 2018-08-23 (×2): 1 via ORAL
  Filled 2018-08-22 (×2): qty 1

## 2018-08-22 MED ORDER — ACETAMINOPHEN 325 MG PO TABS: 650.0000 mg | ORAL_TABLET | ORAL | Status: DC | PRN

## 2018-08-22 MED ORDER — DIPHENHYDRAMINE HCL 50 MG/ML IJ SOLN: 12.5000 mg | INTRAMUSCULAR | Status: DC | PRN

## 2018-08-22 MED ORDER — OXYCODONE-ACETAMINOPHEN 5-325 MG PO TABS
1.0000 | ORAL_TABLET | ORAL | Status: DC | PRN
  Administered 2018-08-22: 1 via ORAL
  Filled 2018-08-22: qty 1

## 2018-08-22 NOTE — H&P (Signed)
Kristin Jones is a 33 y.o. female G3P2002 at 29 weeks presenting for elective IOL.  Patient started contracting this morning at 3 am; no LOF or VB.  Active FM.  Antepartum course uncomplicated.  GBS negative.   OB History    Gravida  3   Para  2   Term  2   Preterm      AB      Living  2     SAB      TAB      Ectopic      Multiple      Live Births  2          Past Medical History:  Diagnosis Date  . Medical history non-contributory   . No pertinent past medical history    Past Surgical History:  Procedure Laterality Date  . NO PAST SURGERIES     Family History: family history includes Diabetes in her father; Hypertension in her father and mother. Social History:  reports that she has never smoked. She has never used smokeless tobacco. She reports that she does not drink alcohol or use drugs.     Maternal Diabetes: No Genetic Screening: Normal Maternal Ultrasounds/Referrals: Normal Fetal Ultrasounds or other Referrals:  None Maternal Substance Abuse:  No Significant Maternal Medications:  None Significant Maternal Lab Results:  Group B Strep negative Other Comments:  None  ROS Maternal Medical History:  Reason for admission: Contractions.   Contractions: Onset was 3-5 hours ago.   Frequency: regular.   Perceived severity is moderate.    Fetal activity: Perceived fetal activity is normal.   Last perceived fetal movement was within the past hour.    Prenatal complications: no prenatal complications Prenatal Complications - Diabetes: none.    Dilation: 7 Effacement (%): 90 Station: Ballotable Exam by:: Dr Lynnette Caffey  Blood pressure 115/74, pulse 73, temperature 98 F (36.7 C), temperature source Oral, resp. rate 20, height 4\' 9"  (1.448 m), weight 53.8 kg. Maternal Exam:  Uterine Assessment: Contraction strength is moderate.  Contraction frequency is regular.   Abdomen: Patient reports no abdominal tenderness. Fundal height is c/w dates.   Estimated  fetal weight is 6#.   Fetal presentation: vertex  Introitus: Normal vulva. Cervix: Cervix evaluated by digital exam.     Fetal Exam Fetal Monitor Review: Baseline rate: 150.  Variability: moderate (6-25 bpm).   Pattern: no decelerations and accelerations present.    Fetal State Assessment: Category I - tracings are normal.     Physical Exam  Constitutional: She is oriented to person, place, and time. She appears well-developed and well-nourished.  GI: Soft. There is no rebound and no guarding.  Genitourinary:    Vulva normal.   Neurological: She is alert and oriented to person, place, and time.  Skin: Skin is warm and dry.  Psychiatric: She has a normal mood and affect. Her behavior is normal.    Prenatal labs: ABO, Rh: A/Positive/-- (12/09 0000) Antibody: Negative (12/09 0000) Rubella:  Immune RPR: Nonreactive (12/09 0000)  HBsAg: Negative (12/09 0000)  HIV: Non-reactive (12/09 0000)  GBS:   Negative  Assessment/Plan: 33yo G3P2002 at 39 weeks planned admission for IOL but arrived in [place] 08/22/2018, 8:07 AM

## 2018-08-22 NOTE — Anesthesia Postprocedure Evaluation (Signed)
Anesthesia Post Note  Patient: Kristin Jones  Procedure(s) Performed: AN AD HOC LABOR EPIDURAL     Patient location during evaluation: Mother Baby Anesthesia Type: Epidural Level of consciousness: awake and alert Pain management: pain level controlled Vital Signs Assessment: post-procedure vital signs reviewed and stable Respiratory status: spontaneous breathing, nonlabored ventilation and respiratory function stable Cardiovascular status: stable Postop Assessment: no headache, no backache, epidural receding, no apparent nausea or vomiting, patient able to bend at knees, adequate PO intake and able to ambulate Anesthetic complications: no    Last Vitals:  Vitals:   08/22/18 1215 08/22/18 1314  BP: 120/64 109/65  Pulse: 72 70  Resp: 20 18  Temp: 36.7 C 36.5 C  SpO2: 100%     Last Pain:  Vitals:   08/22/18 1449  TempSrc:   PainSc: 0-No pain   Pain Goal:                   [employer]

## 2018-08-22 NOTE — Anesthesia Procedure Notes (Signed)
Epidural Patient location during procedure: OB  Staffing Anesthesiologist: Montez Hageman, MD Performed: anesthesiologist   Preanesthetic Checklist Completed: patient identified, site marked, surgical consent, pre-op evaluation, timeout performed, IV checked, risks and benefits discussed and monitors and equipment checked  Epidural Patient position: sitting Prep: DuraPrep Patient monitoring: heart rate, continuous pulse ox and blood pressure Approach: right paramedian Location: L3-L4 Injection technique: LOR saline  Needle:  Needle type: Tuohy  Needle gauge: 17 G Needle length: 9 cm and 9 Needle insertion depth: 5 cm Catheter type: closed end flexible Catheter size: 20 Guage Catheter at skin depth: 9 cm Test dose: negative  Assessment Events: blood not aspirated, injection not painful, no injection resistance, negative IV test and no paresthesia  Additional Notes Patient identified. Risks/Benefits/Options discussed with patient including but not limited to bleeding, infection, nerve damage, paralysis, failed block, incomplete pain control, headache, blood pressure changes, nausea, vomiting, reactions to medication both or allergic, itching and postpartum back pain. Confirmed with bedside nurse the patient's most recent platelet count. Confirmed with patient that they are not currently taking any anticoagulation, have any bleeding history or any family history of bleeding disorders. Patient expressed understanding and wished to proceed. All questions were answered. Sterile technique was used throughout the entire procedure. Please see nursing notes for vital signs. Test dose was given through epidural needle and negative prior to continuing to dose epidural or start infusion. Warning signs of high block given to the patient including shortness of breath, tingling/numbness in hands, complete motor block, or any concerning symptoms with instructions to call for help. Patient was given  instructions on fall risk and not to get out of bed. All questions and concerns addressed with instructions to call with any issues.

## 2018-08-22 NOTE — Anesthesia Preprocedure Evaluation (Signed)

## 2018-08-23 LAB — ABO/RH: ABO/RH(D): A POS

## 2018-08-23 LAB — CBC
HCT: 35.5 % — ABNORMAL LOW (ref 36.0–46.0)
Hemoglobin: 11.8 g/dL — ABNORMAL LOW (ref 12.0–15.0)
MCH: 30 pg (ref 26.0–34.0)
MCHC: 33.2 g/dL (ref 30.0–36.0)
MCV: 90.3 fL (ref 80.0–100.0)
Platelets: 224 10*3/uL (ref 150–400)
RBC: 3.93 MIL/uL (ref 3.87–5.11)
RDW: 13.2 % (ref 11.5–15.5)
WBC: 13.8 10*3/uL — ABNORMAL HIGH (ref 4.0–10.5)
nRBC: 0 % (ref 0.0–0.2)

## 2018-08-23 NOTE — Discharge Summary (Signed)
Obstetric Discharge Summary Reason for Admission: induction of labor Prenatal Procedures: none Intrapartum Procedures: spontaneous vaginal delivery Postpartum Procedures: none Complications-Operative and Postpartum: 2 degree perineal laceration Hemoglobin  Date Value Ref Range Status  08/23/2018 11.8 (L) 12.0 - 15.0 g/dL Final   HCT  Date Value Ref Range Status  08/23/2018 35.5 (L) 36.0 - 46.0 % Final    Physical Exam:  General: alert, cooperative, appears stated age and no distress Lochia: appropriate Uterine Fundus: firm Incision: healing well DVT Evaluation: No evidence of DVT seen on physical exam.  Discharge Diagnoses: Term Pregnancy-delivered  Discharge Information: Date: 08/23/2018 Activity: pelvic rest Diet: routine Medications: None Condition: stable Instructions: refer to practice specific booklet Discharge to: home   Newborn Data: Live born female  Birth Weight: 7 lb (3175 g) APGAR: 8, 9  Newborn Delivery   Birth date/time: 08/22/2018 10:35:00 Delivery type: Vaginal, Spontaneous      Home with mother.  Luz Lex 08/23/2018, 1:11 PM

## 2018-08-23 NOTE — Discharge Instructions (Addendum)
°  ° ° °  [facility] [address] [place], [place]  [address] Phone:  [phone]   August 23, 2018  Patient: Kristin Jones   Date of Birth: 1985/09/06  Date of Visit: August 22, 2018 to August 23, 2018    To Whom It May Concern:   Kristin Jones was at the birth of his son.            If you have any questions or concerns, please don't hesitate to call.   Sincerely,       Treatment Team:  Attending Provider: Linda Hedges, DO

## 2018-08-23 NOTE — Progress Notes (Signed)
Post Partum Day 1 Subjective: no complaints, up ad lib, voiding and tolerating PO  Objective: Blood pressure 102/72, pulse 74, temperature 98.3 F (36.8 C), temperature source Oral, resp. rate 18, height 4\' 9"  (1.448 m), weight 53.8 kg, SpO2 98 %, unknown if currently breastfeeding.  Physical Exam:  General: alert, cooperative, appears stated age and no distress Lochia: appropriate Uterine Fundus: firm Incision: healing well DVT Evaluation: No evidence of DVT seen on physical exam.  Recent Labs    08/22/18 0740 08/23/18 0459  HGB 13.9 11.8*  HCT 40.8 35.5*    Assessment/Plan: Plan for discharge tomorrow and Breastfeeding  Declines circumcision   LOS: 1 day   Luz Lex 08/23/2018, 9:29 AM

## 2018-08-23 NOTE — Progress Notes (Signed)
CSW spoke with MD regarding MOB requesting assistance with getting established with [facility]. CSW spoke with MOB at bedside with the assistance of Interpreter, Sno. MOB reported she had questions about how to get set up with [facility]. CSW offered to place appointment request through [facility] website so that they could reach out to family and set up appointment due to language barrier. CSW has placed appointment request through [facility] website and they should reach out to family. MOB denied any further questions or concerns. Please reconsult CSW if needs arise.  Elijio Miles, [place]  [facility] and [employer] [phone]

## 2018-09-13 DIAGNOSIS — R6889 Other general symptoms and signs: Secondary | ICD-10-CM | POA: Diagnosis not present

## 2018-09-13 DIAGNOSIS — R209 Unspecified disturbances of skin sensation: Secondary | ICD-10-CM | POA: Diagnosis not present

## 2018-09-13 DIAGNOSIS — Z1389 Encounter for screening for other disorder: Secondary | ICD-10-CM | POA: Diagnosis not present

## 2018-09-21 DIAGNOSIS — R200 Anesthesia of skin: Secondary | ICD-10-CM | POA: Diagnosis not present

## 2018-10-02 DIAGNOSIS — Z1389 Encounter for screening for other disorder: Secondary | ICD-10-CM | POA: Diagnosis not present

## 2018-10-02 DIAGNOSIS — R200 Anesthesia of skin: Secondary | ICD-10-CM | POA: Diagnosis not present

## 2018-10-16 ENCOUNTER — Other Ambulatory Visit: Payer: Self-pay

## 2018-10-16 DIAGNOSIS — Z20822 Contact with and (suspected) exposure to covid-19: Secondary | ICD-10-CM

## 2018-10-16 DIAGNOSIS — R6889 Other general symptoms and signs: Secondary | ICD-10-CM | POA: Diagnosis not present

## 2018-10-17 LAB — NOVEL CORONAVIRUS, NAA: SARS-CoV-2, NAA: NOT DETECTED

## 2018-11-23 DIAGNOSIS — R110 Nausea: Secondary | ICD-10-CM | POA: Diagnosis not present

## 2018-11-29 ENCOUNTER — Other Ambulatory Visit: Payer: Self-pay | Admitting: Gastroenterology

## 2018-11-29 DIAGNOSIS — R110 Nausea: Secondary | ICD-10-CM

## 2018-12-05 ENCOUNTER — Ambulatory Visit
Admission: RE | Admit: 2018-12-05 | Discharge: 2018-12-05 | Disposition: A | Discharge status: Home / Self Care | Payer: BC Managed Care – PPO | Source: Ambulatory Visit | Attending: Gastroenterology | Admitting: Gastroenterology

## 2018-12-05 DIAGNOSIS — R110 Nausea: Secondary | ICD-10-CM

## 2018-12-29 DIAGNOSIS — R7401 Elevation of levels of liver transaminase levels: Secondary | ICD-10-CM | POA: Diagnosis not present

## 2018-12-29 DIAGNOSIS — R200 Anesthesia of skin: Secondary | ICD-10-CM | POA: Diagnosis not present

## 2018-12-29 DIAGNOSIS — R6889 Other general symptoms and signs: Secondary | ICD-10-CM | POA: Diagnosis not present

## 2018-12-29 DIAGNOSIS — Z1329 Encounter for screening for other suspected endocrine disorder: Secondary | ICD-10-CM | POA: Diagnosis not present

## 2018-12-29 DIAGNOSIS — Z309 Encounter for contraceptive management, unspecified: Secondary | ICD-10-CM | POA: Diagnosis not present

## 2019-01-10 DIAGNOSIS — R110 Nausea: Secondary | ICD-10-CM | POA: Diagnosis not present

## 2019-01-10 DIAGNOSIS — R945 Abnormal results of liver function studies: Secondary | ICD-10-CM | POA: Diagnosis not present

## 2019-01-15 DIAGNOSIS — Z7189 Other specified counseling: Secondary | ICD-10-CM | POA: Diagnosis not present

## 2019-01-15 DIAGNOSIS — R202 Paresthesia of skin: Secondary | ICD-10-CM | POA: Diagnosis not present

## 2019-01-15 DIAGNOSIS — R61 Generalized hyperhidrosis: Secondary | ICD-10-CM | POA: Diagnosis not present

## 2019-01-15 DIAGNOSIS — N951 Menopausal and female climacteric states: Secondary | ICD-10-CM | POA: Diagnosis not present

## 2019-01-17 DIAGNOSIS — R945 Abnormal results of liver function studies: Secondary | ICD-10-CM | POA: Diagnosis not present

## 2019-01-17 DIAGNOSIS — R110 Nausea: Secondary | ICD-10-CM | POA: Diagnosis not present

## 2019-03-01 ENCOUNTER — Other Ambulatory Visit: Payer: Self-pay | Admitting: Internal Medicine

## 2019-03-01 DIAGNOSIS — R202 Paresthesia of skin: Secondary | ICD-10-CM | POA: Diagnosis not present

## 2019-03-01 DIAGNOSIS — N951 Menopausal and female climacteric states: Secondary | ICD-10-CM | POA: Diagnosis not present

## 2019-03-01 DIAGNOSIS — R519 Headache, unspecified: Secondary | ICD-10-CM

## 2019-03-01 DIAGNOSIS — Z7189 Other specified counseling: Secondary | ICD-10-CM | POA: Diagnosis not present

## 2019-03-01 DIAGNOSIS — R61 Generalized hyperhidrosis: Secondary | ICD-10-CM | POA: Diagnosis not present

## 2019-03-08 DIAGNOSIS — R5383 Other fatigue: Secondary | ICD-10-CM | POA: Diagnosis not present

## 2019-03-08 DIAGNOSIS — R748 Abnormal levels of other serum enzymes: Secondary | ICD-10-CM | POA: Diagnosis not present

## 2019-04-11 DIAGNOSIS — N951 Menopausal and female climacteric states: Secondary | ICD-10-CM | POA: Diagnosis not present

## 2019-04-11 DIAGNOSIS — Z7189 Other specified counseling: Secondary | ICD-10-CM | POA: Diagnosis not present

## 2019-04-11 DIAGNOSIS — R202 Paresthesia of skin: Secondary | ICD-10-CM | POA: Diagnosis not present

## 2019-04-11 DIAGNOSIS — E569 Vitamin deficiency, unspecified: Secondary | ICD-10-CM | POA: Diagnosis not present

## 2019-04-11 DIAGNOSIS — R61 Generalized hyperhidrosis: Secondary | ICD-10-CM | POA: Diagnosis not present

## 2019-05-10 ENCOUNTER — Ambulatory Visit
Admission: RE | Admit: 2019-05-10 | Discharge: 2019-05-10 | Disposition: A | Discharge status: Home / Self Care | Payer: BC Managed Care – PPO | Source: Ambulatory Visit | Attending: Internal Medicine | Admitting: Internal Medicine

## 2019-05-10 ENCOUNTER — Other Ambulatory Visit: Payer: Self-pay

## 2019-05-10 DIAGNOSIS — R519 Headache, unspecified: Secondary | ICD-10-CM | POA: Diagnosis not present

## 2019-05-10 MED ORDER — GADOBENATE DIMEGLUMINE 529 MG/ML IV SOLN
9.0000 mL | Freq: Once | INTRAVENOUS | Status: AC | PRN
  Administered 2019-05-10: 9 mL via INTRAVENOUS

## 2019-05-14 DIAGNOSIS — R748 Abnormal levels of other serum enzymes: Secondary | ICD-10-CM | POA: Diagnosis not present

## 2019-05-24 DIAGNOSIS — R519 Headache, unspecified: Secondary | ICD-10-CM | POA: Diagnosis not present

## 2019-06-07 DIAGNOSIS — H9319 Tinnitus, unspecified ear: Secondary | ICD-10-CM | POA: Diagnosis not present

## 2019-06-07 DIAGNOSIS — H6123 Impacted cerumen, bilateral: Secondary | ICD-10-CM | POA: Diagnosis not present

## 2019-06-07 DIAGNOSIS — G44219 Episodic tension-type headache, not intractable: Secondary | ICD-10-CM | POA: Diagnosis not present

## 2019-08-23 DIAGNOSIS — Z419 Encounter for procedure for purposes other than remedying health state, unspecified: Secondary | ICD-10-CM | POA: Diagnosis not present

## 2019-09-23 DIAGNOSIS — Z419 Encounter for procedure for purposes other than remedying health state, unspecified: Secondary | ICD-10-CM | POA: Diagnosis not present

## 2019-10-24 DIAGNOSIS — Z419 Encounter for procedure for purposes other than remedying health state, unspecified: Secondary | ICD-10-CM | POA: Diagnosis not present

## 2019-11-23 DIAGNOSIS — Z419 Encounter for procedure for purposes other than remedying health state, unspecified: Secondary | ICD-10-CM | POA: Diagnosis not present

## 2019-12-24 DIAGNOSIS — Z419 Encounter for procedure for purposes other than remedying health state, unspecified: Secondary | ICD-10-CM | POA: Diagnosis not present

## 2020-01-02 ENCOUNTER — Other Ambulatory Visit: Payer: Self-pay | Admitting: Obstetrics and Gynecology

## 2020-01-02 DIAGNOSIS — E041 Nontoxic single thyroid nodule: Secondary | ICD-10-CM

## 2020-01-11 ENCOUNTER — Other Ambulatory Visit: Payer: BC Managed Care – PPO

## 2020-01-23 DIAGNOSIS — Z419 Encounter for procedure for purposes other than remedying health state, unspecified: Secondary | ICD-10-CM | POA: Diagnosis not present

## 2020-01-25 ENCOUNTER — Other Ambulatory Visit: Payer: BC Managed Care – PPO

## 2020-02-08 ENCOUNTER — Ambulatory Visit
Admission: RE | Admit: 2020-02-08 | Discharge: 2020-02-08 | Disposition: A | Discharge status: Home / Self Care | Payer: BC Managed Care – PPO | Source: Ambulatory Visit | Attending: Obstetrics and Gynecology | Admitting: Obstetrics and Gynecology

## 2020-02-08 ENCOUNTER — Other Ambulatory Visit: Payer: BC Managed Care – PPO

## 2020-02-08 DIAGNOSIS — E041 Nontoxic single thyroid nodule: Secondary | ICD-10-CM

## 2020-02-18 ENCOUNTER — Other Ambulatory Visit: Payer: Self-pay | Admitting: Obstetrics and Gynecology

## 2020-02-18 DIAGNOSIS — E041 Nontoxic single thyroid nodule: Secondary | ICD-10-CM

## 2020-02-23 DIAGNOSIS — Z419 Encounter for procedure for purposes other than remedying health state, unspecified: Secondary | ICD-10-CM | POA: Diagnosis not present

## 2020-03-04 ENCOUNTER — Other Ambulatory Visit (HOSPITAL_COMMUNITY)
Admission: RE | Admit: 2020-03-04 | Discharge: 2020-03-04 | Disposition: A | Discharge status: Home / Self Care | Payer: BC Managed Care – PPO | Source: Ambulatory Visit | Attending: Radiology | Admitting: Radiology

## 2020-03-04 ENCOUNTER — Ambulatory Visit
Admission: RE | Admit: 2020-03-04 | Discharge: 2020-03-04 | Disposition: A | Discharge status: Home / Self Care | Payer: BC Managed Care – PPO | Source: Ambulatory Visit | Attending: Obstetrics and Gynecology | Admitting: Obstetrics and Gynecology

## 2020-03-04 DIAGNOSIS — E041 Nontoxic single thyroid nodule: Secondary | ICD-10-CM

## 2020-03-04 DIAGNOSIS — C73 Malignant neoplasm of thyroid gland: Secondary | ICD-10-CM | POA: Diagnosis not present

## 2020-03-06 LAB — CYTOLOGY - NON PAP

## 2020-03-22 ENCOUNTER — Other Ambulatory Visit: Payer: Self-pay

## 2020-03-22 ENCOUNTER — Emergency Department (HOSPITAL_COMMUNITY)
Admission: EM | Admit: 2020-03-22 | Discharge: 2020-03-23 | Disposition: A | Discharge status: Home / Self Care | Payer: BC Managed Care – PPO | Attending: Emergency Medicine | Admitting: Emergency Medicine

## 2020-03-22 DIAGNOSIS — R109 Unspecified abdominal pain: Secondary | ICD-10-CM | POA: Diagnosis present

## 2020-03-22 DIAGNOSIS — R1084 Generalized abdominal pain: Secondary | ICD-10-CM | POA: Diagnosis not present

## 2020-03-22 LAB — URINALYSIS, ROUTINE W REFLEX MICROSCOPIC
Bacteria, UA: NONE SEEN
Bilirubin Urine: NEGATIVE
Glucose, UA: NEGATIVE mg/dL
Ketones, ur: NEGATIVE mg/dL
Nitrite: NEGATIVE
Protein, ur: NEGATIVE mg/dL
Specific Gravity, Urine: 1.018 (ref 1.005–1.030)
pH: 5 (ref 5.0–8.0)

## 2020-03-22 LAB — LIPASE, BLOOD: Lipase: 24 U/L (ref 11–51)

## 2020-03-22 LAB — CBC
HCT: 38.8 % (ref 36.0–46.0)
Hemoglobin: 13.2 g/dL (ref 12.0–15.0)
MCH: 30.7 pg (ref 26.0–34.0)
MCHC: 34 g/dL (ref 30.0–36.0)
MCV: 90.2 fL (ref 80.0–100.0)
Platelets: 324 10*3/uL (ref 150–400)
RBC: 4.3 MIL/uL (ref 3.87–5.11)
RDW: 12.5 % (ref 11.5–15.5)
WBC: 6.5 10*3/uL (ref 4.0–10.5)
nRBC: 0 % (ref 0.0–0.2)

## 2020-03-22 LAB — COMPREHENSIVE METABOLIC PANEL
ALT: 28 U/L (ref 0–44)
AST: 20 U/L (ref 15–41)
Albumin: 3.8 g/dL (ref 3.5–5.0)
Alkaline Phosphatase: 46 U/L (ref 38–126)
Anion gap: 9 (ref 5–15)
BUN: 5 mg/dL — ABNORMAL LOW (ref 6–20)
CO2: 27 mmol/L (ref 22–32)
Calcium: 9.1 mg/dL (ref 8.9–10.3)
Chloride: 103 mmol/L (ref 98–111)
Creatinine, Ser: 0.56 mg/dL (ref 0.44–1.00)
GFR, Estimated: >60 mL/min (ref >60)
Glucose, Bld: 85 mg/dL (ref 70–99)
Potassium: 3.4 mmol/L — ABNORMAL LOW (ref 3.5–5.1)
Sodium: 139 mmol/L (ref 135–145)
Total Bilirubin: 0.5 mg/dL (ref 0.3–1.2)
Total Protein: 7.5 g/dL (ref 6.5–8.1)

## 2020-03-22 LAB — I-STAT BETA HCG BLOOD, ED (MC, WL, AP ONLY): I-stat hCG, quantitative: <5 m[IU]/mL (ref <5)

## 2020-03-22 NOTE — ED Triage Notes (Signed)
Pt presents to ED POV. Pt has daughter with her to translate. Pt c/o generalized abd pain. Pt states that it began 2d ago. Pt states that her pain is burning and feels like something is moving around in her stomach. LMP is 03/18/20. Pt denies any other GI/GU s/s

## 2020-03-23 ENCOUNTER — Emergency Department (HOSPITAL_COMMUNITY): Payer: BC Managed Care – PPO

## 2020-03-23 DIAGNOSIS — R1084 Generalized abdominal pain: Secondary | ICD-10-CM | POA: Diagnosis not present

## 2020-03-23 MED ORDER — PANTOPRAZOLE SODIUM 20 MG PO TBEC: 20.0000 mg | DELAYED_RELEASE_TABLET | Freq: Every day | ORAL | 0 refills | Status: DC

## 2020-03-23 MED ORDER — ALUM & MAG HYDROXIDE-SIMETH 400-400-40 MG/5ML PO SUSP: 15.0000 mL | Freq: Four times a day (QID) | ORAL | 0 refills | Status: AC | PRN

## 2020-03-23 MED ORDER — METOCLOPRAMIDE HCL 5 MG/ML IJ SOLN
10.0000 mg | Freq: Once | INTRAMUSCULAR | Status: AC
  Administered 2020-03-23: 10 mg via INTRAVENOUS
  Filled 2020-03-23: qty 2

## 2020-03-23 MED ORDER — METOCLOPRAMIDE HCL 10 MG PO TABS: 10.0000 mg | ORAL_TABLET | Freq: Three times a day (TID) | ORAL | 0 refills | Status: AC | PRN

## 2020-03-23 MED ORDER — PANTOPRAZOLE SODIUM 40 MG PO TBEC
40.0000 mg | DELAYED_RELEASE_TABLET | Freq: Once | ORAL | Status: AC
  Administered 2020-03-23: 40 mg via ORAL
  Filled 2020-03-23: qty 1

## 2020-03-23 MED ORDER — ALUM & MAG HYDROXIDE-SIMETH 200-200-20 MG/5ML PO SUSP
30.0000 mL | Freq: Once | ORAL | Status: AC
  Administered 2020-03-23: 30 mL via ORAL
  Filled 2020-03-23: qty 30

## 2020-03-23 MED ORDER — IOHEXOL 300 MG/ML  SOLN
80.0000 mL | Freq: Once | INTRAMUSCULAR | Status: AC | PRN
  Administered 2020-03-23: 80 mL via INTRAVENOUS

## 2020-03-23 NOTE — ED Notes (Signed)
Patient transported to CT 

## 2020-03-23 NOTE — ED Provider Notes (Signed)
[facility] EMERGENCY DEPARTMENT Provider Note   CSN: [id number] Arrival date & time: 03/22/20  1842     History Chief Complaint  Patient presents with   Abdominal Pain    Kristin Jones is a 35 y.o. female.  35 year old female who only speaks Montagnard which is a dialect of [place] for which we do not have an interpreter at this time but her sister interprets for her.  It sounds like the main reason she is here she is had 2 days of epigastric burning.  She is tells medical students worse at night but tells me that its similar throughout the day.  It will come on and last about 10 minutes and then go away for an hour or 2.  Does not seem to be related to eating.  Has not taken thing for symptoms.  She also notes that when she pushes in the area that hurts it makes her nauseous.  Does not radiate.  No known fevers.  No diarrhea or constipation however she does have a secondary complaint of about a year of lower abdominal discomfort.  She describes it as something moving around associated with a cramping feeling.  She is had about 6 to 8 pound weight loss in the last 1 to 2 months that is unintentional.  She states her appetite is about the same.  She states that she had some dark vaginal bleeding about 3 to 4 days ago that was abnormal for her.  She normally has normal menstrual cycles.   Abdominal Pain Pain location:  Epigastric and suprapubic Pain quality: aching and burning   Pain radiates to:  Does not radiate Pain severity:  Mild Onset quality:  Gradual Timing:  Constant Chronicity:  New      Past Medical History:  Diagnosis Date   Medical history non-contributory    No pertinent past medical history     Patient Active Problem List   Diagnosis Date Noted   Pregnancy 08/22/2018   Infertility counseling 11/06/2014   Surveillance of other previously prescribed contraceptive method 05/14/2011    Past Surgical History:  Procedure Laterality Date    NO PAST SURGERIES       OB History    Gravida  3   Para  3   Term  3   Preterm      AB      Living  3     SAB      IAB      Ectopic      Multiple  0   Live Births  3           Family History  Problem Relation Age of Onset   Hypertension Father    Diabetes Father    Hypertension Mother     Social History   Tobacco Use   Smoking status: Never Smoker   Smokeless tobacco: Never Used  Substance Use Topics   Alcohol use: No   Drug use: No    Home Medications Prior to Admission medications   Medication Sig Start Date End Date Taking? Authorizing Provider  alum & mag hydroxide-simeth (MAALOX MAX) 400-400-40 MG/5ML suspension Take 15 mLs by mouth every 6 (six) hours as needed for indigestion. 03/23/20  Yes Nikeia Henkes, Corene Cornea, MD  metoCLOPramide (REGLAN) 10 MG tablet Take 1 tablet (10 mg total) by mouth every 8 (eight) hours as needed for nausea. 03/23/20  Yes Evelynn Hench, Corene Cornea, MD  pantoprazole (PROTONIX) 20 MG tablet Take 1 tablet (20 mg total)  by mouth daily. 03/23/20 04/22/20 Yes Marnie Fazzino, Barbara Cower, MD    Allergies    Patient has no known allergies.  Review of Systems   Review of Systems  Gastrointestinal: Positive for abdominal pain.  All other systems reviewed and are negative.   Physical Exam Updated Vital Signs BP 101/61 (BP Location: Left Arm)    Pulse 62    Temp 98.3 F (36.8 C) (Oral)    Resp 16    SpO2 99%   Physical Exam Vitals and nursing note reviewed.  Constitutional:      Appearance: She is well-developed and well-nourished.  HENT:     Head: Normocephalic and atraumatic.     Mouth/Throat:     Mouth: Mucous membranes are moist.     Pharynx: Oropharynx is clear.  Eyes:     Pupils: Pupils are equal, round, and reactive to light.  Cardiovascular:     Rate and Rhythm: Normal rate and regular rhythm.  Pulmonary:     Effort: No respiratory distress.     Breath sounds: No stridor.  Abdominal:     General: There is no distension.      Tenderness: There is abdominal tenderness (makes her more nauseous) in the epigastric area.  Musculoskeletal:        General: No swelling or tenderness. Normal range of motion.     Cervical back: Normal range of motion.  Skin:    General: Skin is warm and dry.  Neurological:     General: No focal deficit present.     Mental Status: She is alert.     ED Results / Procedures / Treatments   Labs (all labs ordered are listed, but only abnormal results are displayed) Labs Reviewed  COMPREHENSIVE METABOLIC PANEL - Abnormal; Notable for the following components:      Result Value   Potassium 3.4 (*)    BUN 5 (*)    All other components within normal limits  URINALYSIS, ROUTINE W REFLEX MICROSCOPIC - Abnormal; Notable for the following components:   APPearance HAZY (*)    Hgb urine dipstick MODERATE (*)    Leukocytes,Ua TRACE (*)    All other components within normal limits  URINE CULTURE  LIPASE, BLOOD  CBC  I-STAT BETA HCG BLOOD, ED ([facility], [facility], [facility] ONLY)    EKG None  Radiology CT ABDOMEN PELVIS W CONTRAST  Result Date: 03/23/2020 CLINICAL DATA:  Abdominal pain EXAM: CT ABDOMEN AND PELVIS WITH CONTRAST TECHNIQUE: Multidetector CT imaging of the abdomen and pelvis was performed using the standard protocol following bolus administration of intravenous contrast. CONTRAST:  13mL OMNIPAQUE IOHEXOL 300 MG/ML  SOLN COMPARISON:  None. FINDINGS: Lower chest: No acute abnormality. Hepatobiliary: No focal liver abnormality is seen. No gallstones, gallbladder wall thickening, or biliary dilatation. Pancreas: Unremarkable. No pancreatic ductal dilatation or surrounding inflammatory changes. Spleen: Normal in size without focal abnormality. Adrenals/Urinary Tract: Adrenal glands are within normal limits. Kidneys show a normal enhancement pattern with normal excretion of contrast material bilaterally. No obstructive changes are seen. Bladder is partially distended. Stomach/Bowel: The appendix is well  visualized and within normal limits. No obstructive or inflammatory changes of the colon are seen. Stomach and small bowel are unremarkable. Vascular/Lymphatic: No significant vascular findings are present. No enlarged abdominal or pelvic lymph nodes. Reproductive: Uterus is retroverted but otherwise within normal limits. No adnexal mass is seen. Other: No abdominal wall hernia or abnormality. No abdominopelvic ascites. Musculoskeletal: No acute or significant osseous findings. IMPRESSION: No acute abnormality noted. Electronically  Signed   By: Inez Catalina M.D.   On: 03/23/2020 02:45    Procedures Procedures   Medications Ordered in ED Medications  pantoprazole (PROTONIX) EC tablet 40 mg (40 mg Oral Given 03/23/20 0120)  alum & mag hydroxide-simeth (MAALOX/MYLANTA) 200-200-20 MG/5ML suspension 30 mL (30 mLs Oral Given 03/23/20 0120)  metoCLOPramide (REGLAN) injection 10 mg (10 mg Intravenous Given 03/23/20 0203)  iohexol (OMNIPAQUE) 300 MG/ML solution 80 mL (80 mLs Intravenous Contrast Given 03/23/20 0229)    ED Course  I have reviewed the triage vital signs and the nursing notes.  Pertinent labs & imaging results that were available during my care of the patient were reviewed by me and considered in my medical decision making (see chart for details).    MDM Rules/Calculators/A&P                         In regards to main complaint, it seems reasonable to be gastritis however with other complaints, concern for some type of metastatic ovarian/uterine cancer. Possible that the new hematuria is from retained blood products related to recent menstrual cycle. Doubt kidney stone. Could also be some type of malignancy.  Plan for ppi/maalox, ct to eval for intraabdominal emergencies. otherwise stable for dc to fu w/ pcp prn.  Ct ok. symtoms improved. GI follow up with PPI.  Marland Kitchen Final Clinical Impression(s) / ED Diagnoses Final diagnoses:  Generalized abdominal pain    Rx / DC Orders ED Discharge  Orders         Ordered    alum & mag hydroxide-simeth (MAALOX MAX) 366-440-34 MG/5ML suspension  Every 6 hours PRN        03/23/20 0404    metoCLOPramide (REGLAN) 10 MG tablet  Every 8 hours PRN        03/23/20 0404    Ambulatory referral to Gastroenterology        03/23/20 0404    pantoprazole (PROTONIX) 20 MG tablet  Daily        03/23/20 0404           Jenafer Winterton, Corene Cornea, MD 03/24/20 7425

## 2020-03-24 LAB — URINE CULTURE: Culture: 80000 — AB

## 2020-03-25 DIAGNOSIS — Z419 Encounter for procedure for purposes other than remedying health state, unspecified: Secondary | ICD-10-CM | POA: Diagnosis not present

## 2020-04-03 ENCOUNTER — Ambulatory Visit: Payer: Self-pay | Admitting: Surgery

## 2020-04-22 DIAGNOSIS — Z419 Encounter for procedure for purposes other than remedying health state, unspecified: Secondary | ICD-10-CM | POA: Diagnosis not present

## 2020-05-02 ENCOUNTER — Encounter: Payer: Self-pay | Admitting: Gastroenterology

## 2020-05-09 NOTE — Progress Notes (Signed)
DUE TO COVID-19 ONLY ONE VISITOR IS ALLOWED TO COME WITH YOU AND STAY IN THE WAITING ROOM ONLY DURING PRE OP AND PROCEDURE DAY OF SURGERY. THE 1 VISITOR  MAY VISIT WITH YOU AFTER SURGERY IN YOUR PRIVATE ROOM DURING VISITING HOURS ONLY!  YOU NEED TO HAVE A COVID 19 TEST ON__3/24/22_____ @_______ , THIS TEST MUST BE DONE BEFORE SURGERY,  COVID TESTING SITE [address] [place] [address], IT IS ON THE RIGHT GOING OUT [address] APPROXIMATELY  2 MINUTES PAST [employer] ON THE RIGHT. ONCE YOUR COVID TEST IS COMPLETED,  PLEASE BEGIN THE QUARANTINE INSTRUCTIONS AS OUTLINED IN YOUR HANDOUT.                Kristin Jones  05/09/2020   Your procedure is scheduled on:  05/19/2020  Report to [facility] Main  Entrance   Report to admitting at    0830 AM     Call this number if you have problems the morning of surgery [phone]    REMEMBER: NO  SOLID FOOD CANDY OR GUM AFTER MIDNIGHT. CLEAR LIQUIDS UNTIL  0730am        . NOTHING BY MOUTH EXCEPT CLEAR LIQUIDS UNTIL 0730am   . PLEASE FINISH ENSURE DRINK PER SURGEON ORDER  WHICH NEEDS TO BE COMPLETED AT 0730am     .      CLEAR LIQUID DIET   Foods Allowed                                                                    Coffee and tea, regular and decaf                            Fruit ices (not with fruit pulp)                                      Iced Popsicles                                    Carbonated beverages, regular and diet                                    Cranberry, grape and apple juices Sports drinks like Gatorade Lightly seasoned clear broth or consume(fat free) Sugar, honey syrup ___________________________________________________________________      BRUSH YOUR TEETH MORNING OF SURGERY AND RINSE YOUR MOUTH OUT, NO CHEWING GUM CANDY OR MINTS.     Take these medicines the morning of surgery with A SIP OF WATER: none   DO NOT TAKE ANY DIABETIC MEDICATIONS DAY OF YOUR SURGERY                                You may not have any metal on your body including hair pins and              piercings  Do not wear jewelry, make-up, lotions,  powders or perfumes, deodorant             Do not wear nail polish on your fingernails.  Do not shave  48 hours prior to surgery.              Men may shave face and neck.   Do not bring valuables to the hospital. [place].  Contacts, dentures or bridgework may not be worn into surgery.  Leave suitcase in the car. After surgery it may be brought to your room.     Patients discharged the day of surgery will not be allowed to drive home. IF YOU ARE HAVING SURGERY AND GOING HOME THE SAME DAY, YOU MUST HAVE AN ADULT TO DRIVE YOU HOME AND BE WITH YOU FOR 24 HOURS. YOU MAY GO HOME BY TAXI OR UBER OR ORTHERWISE, BUT AN ADULT MUST ACCOMPANY YOU HOME AND STAY WITH YOU FOR 24 HOURS.  Name and phone number of your driver:  Special Instructions: N/A              Please read over the following fact sheets you were given: _____________________________________________________________________  [facility] - Preparing for Surgery Before surgery, you can play an important role.  Because skin is not sterile, your skin needs to be as free of germs as possible.  You can reduce the number of germs on your skin by washing with CHG (chlorahexidine gluconate) soap before surgery.  CHG is an antiseptic cleaner which kills germs and bonds with the skin to continue killing germs even after washing. Please DO NOT use if you have an allergy to CHG or antibacterial soaps.  If your skin becomes reddened/irritated stop using the CHG and inform your nurse when you arrive at Short Stay. Do not shave (including legs and underarms) for at least 48 hours prior to the first CHG shower.  You may shave your face/neck. Please follow these instructions carefully:  1.  Shower with CHG Soap the night before surgery and the  morning of Surgery.  2.   If you choose to wash your hair, wash your hair first as usual with your  normal  shampoo.  3.  After you shampoo, rinse your hair and body thoroughly to remove the  shampoo.                           4.  Use CHG as you would any other liquid soap.  You can apply chg directly  to the skin and wash                       Gently with a scrungie or clean washcloth.  5.  Apply the CHG Soap to your body ONLY FROM THE NECK DOWN.   Do not use on face/ open                           Wound or open sores. Avoid contact with eyes, ears mouth and genitals (private parts).                       Wash face,  Genitals (private parts) with your normal soap.             6.  Wash thoroughly, paying special attention to the  area where your surgery  will be performed.  7.  Thoroughly rinse your body with warm water from the neck down.  8.  DO NOT shower/wash with your normal soap after using and rinsing off  the CHG Soap.                9.  Pat yourself dry with a clean towel.            10.  Wear clean pajamas.            11.  Place clean sheets on your bed the night of your first shower and do not  sleep with pets. Day of Surgery : Do not apply any lotions/deodorants the morning of surgery.  Please wear clean clothes to the hospital/surgery center.  FAILURE TO FOLLOW THESE INSTRUCTIONS MAY RESULT IN THE CANCELLATION OF YOUR SURGERY PATIENT SIGNATURE_________________________________  NURSE SIGNATURE__________________________________  ________________________________________________________________________

## 2020-05-12 ENCOUNTER — Encounter: Payer: Self-pay | Admitting: Surgery

## 2020-05-12 DIAGNOSIS — E041 Nontoxic single thyroid nodule: Secondary | ICD-10-CM | POA: Diagnosis present

## 2020-05-12 DIAGNOSIS — C73 Malignant neoplasm of thyroid gland: Secondary | ICD-10-CM | POA: Diagnosis present

## 2020-05-12 NOTE — H&P (Signed)
General Surgery [facility]  Kristin Jones DOB: 05-13-1985 Widowed / Language: Undefined / Race: Asian Female   History of Present Illness   The patient is a 35 year old female who presents with thyroid cancer.  CHIEF COMPLAINT: papillary thyroid carcinoma  Patient is referred by Dr. Arvella Nigh for surgical evaluation and management of newly diagnosed papillary thyroid carcinoma. Patient states that she had noted a nodule in the left neck approximately 4 months ago. On physical examination and her recent visit with her gynecologist, she was noted to have a palpable left thyroid nodule. Patient underwent ultrasound examination on February 08, 2020. This demonstrated a 2.9 cm mass in the left thyroid lobe. On March 04, 2020 the patient underwent fine-needle aspiration biopsy. This demonstrated findings consistent with papillary thyroid carcinoma, Bethesda category VI. Patient is now referred to surgery for evaluation and management. Her most recent TSH level is normal at 1.16. Patient has had no prior history of head or neck surgery. She has never been on thyroid medication. There is no family history of thyroid malignancy. Patient is [place]. She presents today with a family member. Her sister is on the telephone and speaks English well and acts as an Astronomer. Patient is provided with written literature and copies of her reports in order to review these again with her sister when she goes home today.   Past Surgical History  No pertinent past surgical history   Allergies  No Known Drug Allergies  Allergies Reconciled   Medication History  Atorvastatin Calcium (20MG  Tablet, Oral) Active.Kristin Jones Fe 1.5/30 (1.5-30MG -MCG Tablet, Oral) Active. Escitalopram Oxalate (20MG  Tablet, Oral) Active. Metoclopramide HCl (10MG  Tablet Disint, Oral) Active. Norlyda (0.35MG  Tablet, Oral) Active. Pantoprazole Sodium (40MG  Packet, Oral) Active traZODone  HCl (50MG  Tablet, Oral) Active. Vitamin D (Ergocalciferol) (50 MCG(2000 UT) Capsule, Oral) Active. WesTab Plus (27-1MG  Tablet, Oral) Active.  Social History  No alcohol use  No caffeine use  No drug use  Tobacco use  Never smoker.  Family History  Diabetes Mellitus  Father.  Pregnancy / Birth History  Gravida  3 Irregular periods  Maternal age  68-25 Para  54  Other Problems  Chest pain  Thyroid Disease   Review of Systems  General Present- Weight Loss. Not Present- Appetite Loss, Chills, Fatigue, Fever, Night Sweats and Weight Gain. Skin Not Present- Change in Wart/Mole, Dryness, Hives, Jaundice, New Lesions, Non-Healing Wounds, Rash and Ulcer. Respiratory Present- Difficulty Breathing. Not Present- Bloody sputum, Chronic Cough, Snoring and Wheezing. Breast Not Present- Breast Mass, Breast Pain, Nipple Discharge and Skin Changes. Cardiovascular Not Present- Chest Pain, Difficulty Breathing Lying Down, Leg Cramps, Palpitations, Rapid Heart Rate, Shortness of Breath and Swelling of Extremities. Female Genitourinary Not Present- Frequency, Nocturia, Painful Urination, Pelvic Pain and Urgency. Neurological Present- Weakness. Not Present- Decreased Memory, Fainting, Headaches, Numbness, Seizures, Tingling, Tremor and Trouble walking. Psychiatric Present- Frequent crying. Not Present- Anxiety, Bipolar, Change in Sleep Pattern, Depression and Fearful. Endocrine Present- Cold Intolerance. Not Present- Excessive Hunger, Hair Changes, Heat Intolerance, Hot flashes and New Diabetes.   Physical Exam  GENERAL APPEARANCE Development: normal Nutritional status: normal Gross deformities: none  SKIN Rash, lesions, ulcers: none Induration, erythema: none Nodules: none palpable  EYES Conjunctiva and lids: normal Pupils: equal and reactive Iris: normal bilaterally  EARS, NOSE, MOUTH, THROAT External ears: no lesion or deformity External nose: no lesion or  deformity Hearing: grossly normal Due to Covid-19 pandemic, patient is wearing a mask.  NECK Symmetric: yes Trachea:  midline Thyroid: There are no palpable nodules in the right thyroid lobe. Left thyroid lobe has a smooth relatively soft nodule in the inferior pole which is mobile with swallowing and nontender. It measures approximately 2-1/2 cm in size. There is no associated lymphadenopathy palpable.  CHEST Respiratory effort: normal Retraction or accessory muscle use: no Breath sounds: normal bilaterally Rales, rhonchi, wheeze: none  CARDIOVASCULAR Auscultation: regular rhythm, normal rate Murmurs: none Pulses: radial pulse 2+ palpable Lower extremity edema: none  MUSCULOSKELETAL Station and gait: normal Digits and nails: no clubbing or cyanosis Muscle strength: grossly normal all extremities Range of motion: grossly normal all extremities Deformity: none  LYMPHATIC Cervical: none palpable Supraclavicular: none palpable  PSYCHIATRIC Oriented to person, place, and time: yes Mood and affect: normal for situation Judgment and insight: appropriate for situation  Assessment & Plan  PAPILLARY THYROID CARCINOMA (C73) LEFT THYROID NODULE (E04.1)  Patient is referred by her gynecologist for her newly identified papillary thyroid carcinoma involving the left thyroid lobe.  Patient provided with a copy of "The Thyroid Book: Medical and Surgical Treatment of Thyroid Problems", published by [employer], 16 pages. Book reviewed and explained to patient during visit today.  We reviewed her ultrasound report, her fine-needle aspiration biopsy report, and her laboratory studies. Her sister was on the telephone and acted as an Astronomer when necessary. I provided her with copies of her reports as well as with an instructional booklet to review at home.  Patient will require a left thyroid lobectomy for definitive diagnosis and management. We discussed the possibility of total  thyroidectomy but I hope this will not be necessary. We discussed the potential need for medication for thyroid hormone supplementation following surgery. We will not know whether she will require thyroid medication until after the surgery and until after additional laboratory testing. We discussed the size and location of the surgical incision. We discussed the risk and benefits of the surgery including the potential for recurrent laryngeal nerve injury causing a change in her voice. The risk of this is small, being approximately 1-2%. We discussed the hospital stay to be anticipated. The patient understands and wishes to proceed with surgery in the near future.  The risks and benefits of the procedure have been discussed at length with the patient. The patient understands the proposed procedure, potential alternative treatments, and the course of recovery to be expected. All of the patient's questions have been answered at this time. The patient wishes to proceed with surgery.  Armandina Gemma, MD [facility] Office: [phone]

## 2020-05-13 ENCOUNTER — Encounter (HOSPITAL_COMMUNITY): Payer: Self-pay

## 2020-05-13 ENCOUNTER — Encounter (HOSPITAL_COMMUNITY)
Admission: RE | Admit: 2020-05-13 | Discharge: 2020-05-13 | Disposition: A | Discharge status: Home / Self Care | Payer: BC Managed Care – PPO | Source: Ambulatory Visit | Attending: Surgery | Admitting: Surgery

## 2020-05-13 ENCOUNTER — Other Ambulatory Visit: Payer: Self-pay

## 2020-05-13 ENCOUNTER — Ambulatory Visit (HOSPITAL_COMMUNITY)
Admission: RE | Admit: 2020-05-13 | Discharge: 2020-05-13 | Disposition: A | Discharge status: Home / Self Care | Payer: BC Managed Care – PPO | Source: Ambulatory Visit | Attending: Anesthesiology | Admitting: Anesthesiology

## 2020-05-13 DIAGNOSIS — Z01818 Encounter for other preprocedural examination: Secondary | ICD-10-CM

## 2020-05-13 HISTORY — DX: Anxiety disorder, unspecified: F41.9

## 2020-05-13 HISTORY — DX: Malignant (primary) neoplasm, unspecified: C80.1

## 2020-05-13 LAB — BASIC METABOLIC PANEL
Anion gap: 11 (ref 5–15)
BUN: 7 mg/dL (ref 6–20)
CO2: 24 mmol/L (ref 22–32)
Calcium: 9.4 mg/dL (ref 8.9–10.3)
Chloride: 105 mmol/L (ref 98–111)
Creatinine, Ser: 0.57 mg/dL (ref 0.44–1.00)
GFR, Estimated: >60 mL/min (ref >60)
Glucose, Bld: 80 mg/dL (ref 70–99)
Potassium: 3.8 mmol/L (ref 3.5–5.1)
Sodium: 140 mmol/L (ref 135–145)

## 2020-05-13 LAB — CBC
HCT: 38.6 % (ref 36.0–46.0)
Hemoglobin: 13 g/dL (ref 12.0–15.0)
MCH: 30.8 pg (ref 26.0–34.0)
MCHC: 33.7 g/dL (ref 30.0–36.0)
MCV: 91.5 fL (ref 80.0–100.0)
Platelets: 322 10*3/uL (ref 150–400)
RBC: 4.22 MIL/uL (ref 3.87–5.11)
RDW: 12.9 % (ref 11.5–15.5)
WBC: 6 10*3/uL (ref 4.0–10.5)
nRBC: 0 % (ref 0.0–0.2)

## 2020-05-13 NOTE — Progress Notes (Signed)
Anesthesia Review:  PCP: none  Cardiologist : none  Chest x-ray :05/13/20 EKG : 05/13/20 Echo : Stress test: Cardiac Cath :  Activity level: can do a flight of stairs without difficulty  Sleep Study/ CPAP :no Fasting Blood Sugar :      / Checks Blood Sugar -- times a day:   Blood Thinner/ Instructions /Last Dose: ASA / Instructions/ Last Dose :  Pt reports at preop with Interpreter present that sometimes she has felt heart beating in her head on different occasions.  Also reports some episodes of dizziness when goes from a lying position to standing up.  PT reports has not happened often .  EKG done at preop along with BMP.

## 2020-05-15 ENCOUNTER — Other Ambulatory Visit (HOSPITAL_COMMUNITY)
Admission: RE | Admit: 2020-05-15 | Discharge: 2020-05-15 | Disposition: A | Discharge status: Home / Self Care | Payer: BC Managed Care – PPO | Source: Ambulatory Visit | Attending: Surgery | Admitting: Surgery

## 2020-05-15 DIAGNOSIS — Z01812 Encounter for preprocedural laboratory examination: Secondary | ICD-10-CM | POA: Insufficient documentation

## 2020-05-15 DIAGNOSIS — Z20822 Contact with and (suspected) exposure to covid-19: Secondary | ICD-10-CM | POA: Insufficient documentation

## 2020-05-15 LAB — SARS CORONAVIRUS 2 (TAT 6-24 HRS): SARS Coronavirus 2: NEGATIVE

## 2020-05-19 ENCOUNTER — Encounter (HOSPITAL_COMMUNITY)
Admission: RE | Disposition: A | Discharge status: Home / Self Care | Payer: Self-pay | Source: Home / Self Care | Attending: Surgery

## 2020-05-19 ENCOUNTER — Ambulatory Visit (HOSPITAL_COMMUNITY)
Admission: RE | Admit: 2020-05-19 | Discharge: 2020-05-20 | Disposition: A | Discharge status: Home / Self Care | Payer: BC Managed Care – PPO | Attending: Surgery | Admitting: Surgery

## 2020-05-19 ENCOUNTER — Encounter (HOSPITAL_COMMUNITY): Payer: Self-pay | Admitting: Surgery

## 2020-05-19 ENCOUNTER — Ambulatory Visit (HOSPITAL_COMMUNITY): Payer: BC Managed Care – PPO

## 2020-05-19 DIAGNOSIS — C73 Malignant neoplasm of thyroid gland: Secondary | ICD-10-CM | POA: Diagnosis present

## 2020-05-19 DIAGNOSIS — E041 Nontoxic single thyroid nodule: Secondary | ICD-10-CM | POA: Diagnosis present

## 2020-05-19 HISTORY — PX: THYROID LOBECTOMY: SHX420

## 2020-05-19 LAB — PREGNANCY, URINE: Preg Test, Ur: NEGATIVE

## 2020-05-19 SURGERY — LOBECTOMY, THYROID
Anesthesia: General | Site: Neck | Laterality: Left

## 2020-05-19 MED ORDER — DEXAMETHASONE SODIUM PHOSPHATE 10 MG/ML IJ SOLN
INTRAMUSCULAR | Status: AC
  Filled 2020-05-19: qty 1

## 2020-05-19 MED ORDER — CHLORHEXIDINE GLUCONATE CLOTH 2 % EX PADS: 6.0000 | MEDICATED_PAD | Freq: Once | CUTANEOUS | Status: DC

## 2020-05-19 MED ORDER — FENTANYL CITRATE (PF) 100 MCG/2ML IJ SOLN
INTRAMUSCULAR | Status: DC | PRN
  Administered 2020-05-19: 100 ug via INTRAVENOUS

## 2020-05-19 MED ORDER — SUCCINYLCHOLINE CHLORIDE 200 MG/10ML IV SOSY
PREFILLED_SYRINGE | INTRAVENOUS | Status: DC | PRN
  Administered 2020-05-19: 120 mg via INTRAVENOUS

## 2020-05-19 MED ORDER — LIDOCAINE 2% (20 MG/ML) 5 ML SYRINGE
INTRAMUSCULAR | Status: AC
  Filled 2020-05-19: qty 5

## 2020-05-19 MED ORDER — OXYCODONE HCL 5 MG PO TABS: 5.0000 mg | ORAL_TABLET | Freq: Once | ORAL | Status: DC | PRN

## 2020-05-19 MED ORDER — PROPOFOL 10 MG/ML IV BOLUS
INTRAVENOUS | Status: DC | PRN
  Administered 2020-05-19: 150 mg via INTRAVENOUS

## 2020-05-19 MED ORDER — ONDANSETRON HCL 4 MG/2ML IJ SOLN: 4.0000 mg | Freq: Four times a day (QID) | INTRAMUSCULAR | Status: DC | PRN

## 2020-05-19 MED ORDER — MIDAZOLAM HCL 2 MG/2ML IJ SOLN
INTRAMUSCULAR | Status: AC
  Filled 2020-05-19: qty 2

## 2020-05-19 MED ORDER — CHLORHEXIDINE GLUCONATE 0.12 % MT SOLN
15.0000 mL | Freq: Once | OROMUCOSAL | Status: AC
  Administered 2020-05-19: 15 mL via OROMUCOSAL

## 2020-05-19 MED ORDER — ACETAMINOPHEN 325 MG PO TABS: 650.0000 mg | ORAL_TABLET | Freq: Four times a day (QID) | ORAL | Status: DC | PRN

## 2020-05-19 MED ORDER — HYDROMORPHONE HCL 1 MG/ML IJ SOLN
0.2500 mg | INTRAMUSCULAR | Status: DC | PRN
  Administered 2020-05-19: 0.25 mg via INTRAVENOUS
  Administered 2020-05-19: 0.5 mg via INTRAVENOUS
  Administered 2020-05-19: 0.25 mg via INTRAVENOUS

## 2020-05-19 MED ORDER — LIDOCAINE 2% (20 MG/ML) 5 ML SYRINGE
INTRAMUSCULAR | Status: DC | PRN
  Administered 2020-05-19: 60 mg via INTRAVENOUS

## 2020-05-19 MED ORDER — OXYCODONE HCL 5 MG/5ML PO SOLN: 5.0000 mg | Freq: Once | ORAL | Status: DC | PRN

## 2020-05-19 MED ORDER — SCOPOLAMINE 1 MG/3DAYS TD PT72
1.0000 | MEDICATED_PATCH | TRANSDERMAL | Status: DC
  Administered 2020-05-19: 1 via TRANSDERMAL
  Filled 2020-05-19: qty 1

## 2020-05-19 MED ORDER — ACETAMINOPHEN 650 MG RE SUPP: 650.0000 mg | Freq: Four times a day (QID) | RECTAL | Status: DC | PRN

## 2020-05-19 MED ORDER — HYDROMORPHONE HCL 1 MG/ML IJ SOLN
INTRAMUSCULAR | Status: AC
  Filled 2020-05-19: qty 1

## 2020-05-19 MED ORDER — MIDAZOLAM HCL 2 MG/2ML IJ SOLN
INTRAMUSCULAR | Status: DC | PRN
  Administered 2020-05-19: 1 mg via INTRAVENOUS

## 2020-05-19 MED ORDER — DEXAMETHASONE SODIUM PHOSPHATE 10 MG/ML IJ SOLN
INTRAMUSCULAR | Status: DC | PRN
  Administered 2020-05-19: 5 mg via INTRAVENOUS

## 2020-05-19 MED ORDER — ONDANSETRON HCL 4 MG/2ML IJ SOLN
INTRAMUSCULAR | Status: AC
  Filled 2020-05-19: qty 2

## 2020-05-19 MED ORDER — SODIUM CHLORIDE 0.45 % IV SOLN: INTRAVENOUS | Status: DC

## 2020-05-19 MED ORDER — ROCURONIUM BROMIDE 10 MG/ML (PF) SYRINGE
PREFILLED_SYRINGE | INTRAVENOUS | Status: DC | PRN
  Administered 2020-05-19: 30 mg via INTRAVENOUS

## 2020-05-19 MED ORDER — NORETHIN ACE-ETH ESTRAD-FE 1.5-30 MG-MCG PO TABS: 1.0000 | ORAL_TABLET | Freq: Every day | ORAL | Status: DC

## 2020-05-19 MED ORDER — OXYCODONE HCL 5 MG PO TABS: 5.0000 mg | ORAL_TABLET | ORAL | Status: DC | PRN

## 2020-05-19 MED ORDER — ORAL CARE MOUTH RINSE: 15.0000 mL | Freq: Once | OROMUCOSAL | Status: AC

## 2020-05-19 MED ORDER — ONDANSETRON 4 MG PO TBDP: 4.0000 mg | ORAL_TABLET | Freq: Four times a day (QID) | ORAL | Status: DC | PRN

## 2020-05-19 MED ORDER — PROPOFOL 10 MG/ML IV BOLUS
INTRAVENOUS | Status: AC
  Filled 2020-05-19: qty 20

## 2020-05-19 MED ORDER — PROMETHAZINE HCL 25 MG/ML IJ SOLN: 6.2500 mg | INTRAMUSCULAR | Status: DC | PRN

## 2020-05-19 MED ORDER — SUGAMMADEX SODIUM 200 MG/2ML IV SOLN
INTRAVENOUS | Status: DC | PRN
  Administered 2020-05-19: 90 mg via INTRAVENOUS

## 2020-05-19 MED ORDER — LACTATED RINGERS IV SOLN: INTRAVENOUS | Status: DC

## 2020-05-19 MED ORDER — HYDROMORPHONE HCL 1 MG/ML IJ SOLN: 1.0000 mg | INTRAMUSCULAR | Status: DC | PRN

## 2020-05-19 MED ORDER — TRAMADOL HCL 50 MG PO TABS
50.0000 mg | ORAL_TABLET | Freq: Four times a day (QID) | ORAL | Status: DC | PRN
Start: 2020-05-19 — End: 2020-05-20
  Administered 2020-05-19: 50 mg via ORAL
  Filled 2020-05-19: qty 1

## 2020-05-19 MED ORDER — ROCURONIUM BROMIDE 10 MG/ML (PF) SYRINGE
PREFILLED_SYRINGE | INTRAVENOUS | Status: AC
  Filled 2020-05-19: qty 10

## 2020-05-19 MED ORDER — CEFAZOLIN SODIUM-DEXTROSE 2-4 GM/100ML-% IV SOLN
2.0000 g | INTRAVENOUS | Status: AC
  Administered 2020-05-19: 2 g via INTRAVENOUS
  Filled 2020-05-19: qty 100

## 2020-05-19 MED ORDER — ONDANSETRON HCL 4 MG/2ML IJ SOLN
INTRAMUSCULAR | Status: DC | PRN
  Administered 2020-05-19: 4 mg via INTRAVENOUS

## 2020-05-19 MED ORDER — FENTANYL CITRATE (PF) 100 MCG/2ML IJ SOLN
INTRAMUSCULAR | Status: AC
  Filled 2020-05-19: qty 2

## 2020-05-19 MED ORDER — 0.9 % SODIUM CHLORIDE (POUR BTL) OPTIME
TOPICAL | Status: DC | PRN
  Administered 2020-05-19: 1000 mL

## 2020-05-19 SURGICAL SUPPLY — 29 items
ATTRACTOMAT 16X20 MAGNETIC DRP (DRAPES) ×2 IMPLANT
BLADE SURG 15 STRL LF DISP TIS (BLADE) ×1 IMPLANT
BLADE SURG 15 STRL SS (BLADE) ×1
CHLORAPREP W/TINT 26 (MISCELLANEOUS) ×2 IMPLANT
CLIP VESOCCLUDE MED 6/CT (CLIP) ×6 IMPLANT
CLIP VESOCCLUDE SM WIDE 6/CT (CLIP) ×4 IMPLANT
COVER SURGICAL LIGHT HANDLE (MISCELLANEOUS) ×2 IMPLANT
COVER WAND RF STERILE (DRAPES) ×2 IMPLANT
DERMABOND ADVANCED (GAUZE/BANDAGES/DRESSINGS) ×1
DERMABOND ADVANCED .7 DNX12 (GAUZE/BANDAGES/DRESSINGS) ×1 IMPLANT
DRAPE LAPAROTOMY T 98X78 PEDS (DRAPES) ×2 IMPLANT
DRAPE UTILITY XL STRL (DRAPES) ×2 IMPLANT
ELECT PENCIL ROCKER SW 15FT (MISCELLANEOUS) ×2 IMPLANT
ELECT REM PT RETURN 15FT ADLT (MISCELLANEOUS) ×2 IMPLANT
GAUZE 4X4 16PLY RFD (DISPOSABLE) ×2 IMPLANT
GLOVE SURG ORTHO LTX SZ8 (GLOVE) ×2 IMPLANT
GOWN STRL REUS W/TWL XL LVL3 (GOWN DISPOSABLE) ×6 IMPLANT
HEMOSTAT SURGICEL 2X4 FIBR (HEMOSTASIS) ×2 IMPLANT
ILLUMINATOR WAVEGUIDE N/F (MISCELLANEOUS) ×2 IMPLANT
KIT BASIN OR (CUSTOM PROCEDURE TRAY) ×2 IMPLANT
KIT TURNOVER KIT A (KITS) ×2 IMPLANT
PACK BASIC VI WITH GOWN DISP (CUSTOM PROCEDURE TRAY) ×2 IMPLANT
SHEARS HARMONIC 9CM CVD (BLADE) ×2 IMPLANT
SUT MNCRL AB 4-0 PS2 18 (SUTURE) ×2 IMPLANT
SUT VIC AB 3-0 SH 18 (SUTURE) ×4 IMPLANT
SYR BULB IRRIG 60ML STRL (SYRINGE) ×2 IMPLANT
TOWEL OR 17X26 10 PK STRL BLUE (TOWEL DISPOSABLE) ×2 IMPLANT
TOWEL OR NON WOVEN STRL DISP B (DISPOSABLE) ×2 IMPLANT
TUBING CONNECTING 10 (TUBING) ×2 IMPLANT

## 2020-05-19 NOTE — Interval H&P Note (Signed)
History and Physical Interval Note:  05/19/2020 10:10 AM  Kristin Jones  has presented today for surgery, with the diagnosis of PAPILLARY THYROID CARCINOMA.  The various methods of treatment have been discussed with the patient and family. After consideration of risks, benefits and other options for treatment, the patient has consented to    Procedure(s): LEFT THYROID LOBECTOMY (Left) as a surgical intervention.    The patient's history has been reviewed, patient examined, no change in status, stable for surgery.  I have reviewed the patient's chart and labs.  Questions were answered to the patient's satisfaction.    Armandina Gemma, MD [facility] Office: [place]

## 2020-05-19 NOTE — Anesthesia Preprocedure Evaluation (Addendum)
Anesthesia Evaluation  Patient identified by MRN, date of birth, ID band Patient awake    Reviewed: Allergy & Precautions, NPO status , Patient's Chart, lab work & pertinent test results  Airway Mallampati: II  TM Distance: >3 FB Neck ROM: Full    Dental no notable dental hx. (+) Teeth Intact, Dental Advisory Given   Pulmonary neg pulmonary ROS,    Pulmonary exam normal breath sounds clear to auscultation       Cardiovascular negative cardio ROS Normal cardiovascular exam Rhythm:Regular Rate:Normal     Neuro/Psych negative neurological ROS  negative psych ROS   GI/Hepatic negative GI ROS, Neg liver ROS,   Endo/Other  Papillary Thyroid cancer Left lobe Hyperlipidemia  Renal/GU negative Renal ROS  negative genitourinary   Musculoskeletal negative musculoskeletal ROS (+)   Abdominal   Peds negative pediatric ROS (+)  Hematology negative hematology ROS (+)   Anesthesia Other Findings   Reproductive/Obstetrics negative OB ROS Hx/o infertility                           Anesthesia Physical Anesthesia Plan  ASA: II  Anesthesia Plan: General   Post-op Pain Management:    Induction: Intravenous  PONV Risk Score and Plan: 3 and Ondansetron, Dexamethasone, Midazolam, Treatment may vary due to age or medical condition and Scopolamine patch - Pre-op  Airway Management Planned: Oral ETT  Additional Equipment: None  Intra-op Plan:   Post-operative Plan: Extubation in OR  Informed Consent: I have reviewed the patients History and Physical, chart, labs and discussed the procedure including the risks, benefits and alternatives for the proposed anesthesia with the patient or authorized representative who has indicated his/her understanding and acceptance.     Dental advisory given  Plan Discussed with: CRNA, Surgeon and Anesthesiologist  Anesthesia Plan Comments:        Anesthesia  Quick Evaluation

## 2020-05-19 NOTE — Transfer of Care (Signed)
Immediate Anesthesia Transfer of Care Note  Patient: Kristin Jones  Procedure(s) Performed: LEFT THYROID LOBECTOMY (Left Neck)  Patient Location: PACU  Anesthesia Type:General  Level of Consciousness: awake and alert   Airway & Oxygen Therapy: Patient Spontanous Breathing and Patient connected to face mask oxygen  Post-op Assessment: Report given to RN and Post -op Vital signs reviewed and stable  Post vital signs: Reviewed and stable  Last Vitals:  Vitals Value Taken Time  BP    Temp    Pulse 73 05/19/20 1156  Resp    SpO2 100 % 05/19/20 1156  Vitals shown include unvalidated device data.  Last Pain:  Vitals:   05/19/20 0900  TempSrc:   PainSc: 0-No pain      Patients Stated Pain Goal: 3 (83/33/83 2919)  Complications: No complications documented.

## 2020-05-19 NOTE — Anesthesia Postprocedure Evaluation (Signed)
Anesthesia Post Note  Patient: Kristin Jones  Procedure(s) Performed: LEFT THYROID LOBECTOMY (Left Neck)     Patient location during evaluation: PACU Anesthesia Type: General Level of consciousness: awake and alert and oriented Pain management: pain level controlled Vital Signs Assessment: post-procedure vital signs reviewed and stable Respiratory status: spontaneous breathing, nonlabored ventilation and respiratory function stable Cardiovascular status: blood pressure returned to baseline and stable Postop Assessment: no apparent nausea or vomiting Anesthetic complications: no   No complications documented.  Last Vitals:  Vitals:   05/19/20 1245 05/19/20 1300  BP: 124/83 115/80  Pulse: 77 72  Resp:    Temp:    SpO2: 96% 96%    Last Pain:  Vitals:   05/19/20 1300  TempSrc:   PainSc: 5                  Akanksha Bellmore A.

## 2020-05-19 NOTE — Anesthesia Procedure Notes (Signed)
Procedure Name: Intubation Date/Time: 05/19/2020 10:33 AM Performed by: Sharlette Dense, CRNA Patient Re-evaluated:Patient Re-evaluated prior to induction Oxygen Delivery Method: Circle system utilized Preoxygenation: Pre-oxygenation with 100% oxygen Induction Type: IV induction Ventilation: Mask ventilation without difficulty Laryngoscope Size: Glidescope and 3 Grade View: Grade I Tube type: Reinforced Tube size: 7.0 mm Number of attempts: 1 Airway Equipment and Method: Video-laryngoscopy Placement Confirmation: ETT inserted through vocal cords under direct vision,  positive ETCO2 and breath sounds checked- equal and bilateral Secured at: 20 cm Tube secured with: Tape Dental Injury: Teeth and Oropharynx as per pre-operative assessment  Comments: Glidescope used for educational purposes

## 2020-05-19 NOTE — Discharge Instructions (Signed)
[facility]  THYROID & PARATHYROID SURGERY:  POST-OP INSTRUCTIONS  Always review your discharge instruction sheet from the facility where your surgery was performed.  A prescription for pain medication may be given to you upon discharge.  Take your pain medication as prescribed.  If narcotic pain medicine is not needed, then you may take acetaminophen (Tylenol) or ibuprofen (Advil) as needed.  Take your usually prescribed medications unless otherwise directed.  If you need a refill on your pain medication, please contact our office during regular business hours.  Prescriptions cannot be processed by our office after 5 pm or on weekends.  Start with a light diet upon arrival home, such as soup and crackers or toast.  Be sure to drink plenty of fluids daily.  Resume your normal diet the day after surgery.  Most patients will experience some swelling and bruising on the chest and neck area.  Ice packs will help.  Swelling and bruising can take several days to resolve.   It is common to experience some constipation after surgery.  Increasing fluid intake and taking a stool softener (Colace) will usually help or prevent this problem.  A mild laxative (Milk of Magnesia or Miralax) should be taken according to package directions if there has been no bowel movement after 48 hours.  You have steri-strips and a gauze dressing over your incision.  You may remove the gauze bandage on the second day after surgery, and you may shower at that time.  Leave your steri-strips (small skin tapes) in place directly over the incision.  These strips should remain on the skin for 5-7 days and then be removed.  You may get them wet in the shower and pat them dry.  You may resume regular (light) daily activities beginning the next day (such as daily self-care, walking, climbing stairs) gradually increasing activities as tolerated.  You may have sexual intercourse when it is comfortable.  Refrain from  any heavy lifting or straining until approved by your doctor.  You may drive when you no longer are taking prescription pain medication, you can comfortably wear a seatbelt, and you can safely maneuver your car and apply brakes.  You should see your doctor in the office for a follow-up appointment approximately three weeks after your surgery.  Make sure that you call for this appointment within a day or two after you arrive home to insure a convenient appointment time.  WHEN TO CALL YOUR DOCTOR: -- Fever greater than 101.5 -- Inability to urinate -- Nausea and/or vomiting - persistent -- Extreme swelling or bruising -- Continued bleeding from incision -- Increased pain, redness, or drainage from the incision -- Difficulty swallowing or breathing -- Muscle cramping or spasms -- Numbness or tingling in hands or around lips  The clinic staff is available to answer your questions during regular business hours.  Please don't hesitate to call and ask to speak to one of the nurses if you have concerns.  Darien Kading, MD [facility] Office: [phone] 

## 2020-05-19 NOTE — Op Note (Signed)
Procedure Note  Pre-operative Diagnosis:  Papillary thyroid carcinoma  Post-operative Diagnosis:  same  Surgeon:  Armandina Gemma, MD  Assistant:  Carlena Hurl, PA-C   Procedure:  left thyroid lobectomy  Anesthesia:  General  Estimated Blood Loss:  minimal  Drains: none         Specimen: thyroid lobe to pathology  Indications:  Patient is referred by Dr. Arvella Nigh for surgical evaluation and management of newly diagnosed papillary thyroid carcinoma. Patient states that she had noted a nodule in the left neck approximately 4 months ago. On physical examination and her recent visit with her gynecologist, she was noted to have a palpable left thyroid nodule. Patient underwent ultrasound examination on February 08, 2020. This demonstrated a 2.9 cm mass in the left thyroid lobe. On March 04, 2020 the patient underwent fine-needle aspiration biopsy. This demonstrated findings consistent with papillary thyroid carcinoma, Bethesda category VI. Patient is now referred to surgery for evaluation and management.   Procedure Details: Procedure was done in OR #1 at the [facility]. The patient was brought to the operating room and placed in a supine position on the operating room table. Following administration of general anesthesia, the patient was positioned and then prepped and draped in the usual aseptic fashion. After ascertaining that an adequate level of anesthesia had been achieved, a small Kocher incision was made with #15 blade. Dissection was carried through subcutaneous tissues and platysma. Hemostasis was achieved with the electrocautery. Skin flaps were elevated cephalad and caudad from the thyroid notch to the sternal notch. A self-retaining retractor was placed for exposure. Strap muscles were incised in the midline and dissection was begun on the left side. Strap muscles were reflected laterally. The left thyroid lobe was slightly enlarged with a dominant soft nodule at the  inferior pole. The lobe was gently mobilized with blunt dissection. Superior pole vessels were dissected out and divided individually between small and medium ligaclips with the harmonic scalpel. The thyroid lobe was rolled anteriorly. Branches of the inferior thyroid artery were divided between small ligaclips with the harmonic scalpel. Inferior venous tributaries were divided between ligaclips. Both the superior and inferior parathyroid glands were identified and preserved on their vascular pedicles. The recurrent laryngeal nerve was identified and preserved along its course. The ligament of Gwenlyn Found was released with the electrocautery and the gland was mobilized onto the anterior trachea. Isthmus was mobilized across the midline. There was no pyramidal lobe present. The thyroid parenchyma was transected at the junction of the isthmus and contralateral thyroid lobe with the harmonic scalpel. The thyroid lobe and isthmus were submitted to pathology for review.  The neck was irrigated with warm saline. Fibrillar was placed throughout the operative field. Strap muscles were approximated in the midline with interrupted 3-0 Vicryl sutures. Platysma was closed with interrupted 3-0 Vicryl sutures. Skin was closed with a running 4-0 Monocryl subcuticular suture.  Wound was washed and dried and Dermabond was applied. The patient was awakened from anesthesia and brought to the recovery room. The patient tolerated the procedure well.   Armandina Gemma, MD [facility] Office: [phone]

## 2020-05-20 ENCOUNTER — Other Ambulatory Visit: Payer: Self-pay

## 2020-05-20 ENCOUNTER — Encounter (HOSPITAL_COMMUNITY): Payer: Self-pay | Admitting: Surgery

## 2020-05-20 DIAGNOSIS — C73 Malignant neoplasm of thyroid gland: Secondary | ICD-10-CM | POA: Diagnosis not present

## 2020-05-20 LAB — SURGICAL PATHOLOGY

## 2020-05-20 MED ORDER — TRAMADOL HCL 50 MG PO TABS: 50.0000 mg | ORAL_TABLET | Freq: Four times a day (QID) | ORAL | 0 refills | Status: AC | PRN

## 2020-05-20 NOTE — Plan of Care (Signed)
Instructions were reviewed with patient. All questions were answered. Patient was transported to main entrance by wheelchair. ° °

## 2020-05-20 NOTE — Discharge Summary (Signed)
Physician Discharge Summary [facility]  Patient ID: Kristin Jones MRN: [id number] DOB/AGE: November 21, 1985 35 y.o.  Admit date: 05/19/2020  Discharge date: 05/20/2020  Discharge Diagnoses:  Principal Problem:   Papillary thyroid carcinoma [facility]) Active Problems:   Left thyroid nodule   Discharged Condition: good  Hospital Course: Patient was admitted for observation following thyroid surgery.  Post op course was uncomplicated.  Pain was well controlled.  Tolerated diet.  Patient was prepared for discharge home on POD#1.  Consults: None  Treatments: surgery: left thyroid lobectomy  Discharge Exam: Blood pressure 105/67, pulse (!) 57, temperature 98 F (36.7 C), temperature source Oral, resp. rate 17, height 4\' 9"  (1.448 m), weight 39.6 kg, last menstrual period 05/10/2020, SpO2 99 %, unknown if currently breastfeeding. HEENT - clear Neck - wound dry and intact; mild STS; voice soft but normal Chest - clear bilaterally Cor - RRR  Disposition: Home  Discharge Instructions    Diet - low sodium heart healthy   Complete by: As directed    Discharge instructions   Complete by: As directed    [facility]  THYROID & PARATHYROID SURGERY:  POST-OP INSTRUCTIONS  Always review your discharge instruction sheet from the facility where your surgery was performed.  A prescription for pain medication may be given to you upon discharge.  Take your pain medication as prescribed.  If narcotic pain medicine is not needed, then you may take acetaminophen (Tylenol) or ibuprofen (Advil) as needed.  Take your usually prescribed medications unless otherwise directed.  If you need a refill on your pain medication, please contact our office during regular business hours.  Prescriptions cannot be processed by our office after 5 pm or on weekends.  Start with a light diet upon arrival home, such as soup and crackers or toast.  Be sure to drink plenty of fluids  daily.  Resume your normal diet the day after surgery.  Most patients will experience some swelling and bruising on the chest and neck area.  Ice packs will help.  Swelling and bruising can take several days to resolve.   It is common to experience some constipation after surgery.  Increasing fluid intake and taking a stool softener (Colace) will usually help or prevent this problem.  A mild laxative (Milk of Magnesia or Miralax) should be taken according to package directions if there has been no bowel movement after 48 hours.  You have steri-strips and a gauze dressing over your incision.  You may remove the gauze bandage on the second day after surgery, and you may shower at that time.  Leave your steri-strips (small skin tapes) in place directly over the incision.  These strips should remain on the skin for 5-7 days and then be removed.  You may get them wet in the shower and pat them dry.  You may resume regular (light) daily activities beginning the next day (such as daily self-care, walking, climbing stairs) gradually increasing activities as tolerated.  You may have sexual intercourse when it is comfortable.  Refrain from any heavy lifting or straining until approved by your doctor.  You may drive when you no longer are taking prescription pain medication, you can comfortably wear a seatbelt, and you can safely maneuver your car and apply brakes.  You should see your doctor in the office for a follow-up appointment approximately three weeks after your surgery.  Make sure that you call for this appointment within a day or two after  you arrive home to insure a convenient appointment time.  WHEN TO CALL YOUR DOCTOR: -- Fever greater than 101.5 -- Inability to urinate -- Nausea and/or vomiting - persistent -- Extreme swelling or bruising -- Continued bleeding from incision -- Increased pain, redness, or drainage from the incision -- Difficulty swallowing or breathing -- Muscle cramping or  spasms -- Numbness or tingling in hands or around lips  The clinic staff is available to answer your questions during regular business hours.  Please don't hesitate to call and ask to speak to one of the nurses if you have concerns.  Armandina Gemma, MD [facility] Office: [phone]   Ice pack   Complete by: As directed    Increase activity slowly   Complete by: As directed    No dressing needed   Complete by: As directed      Allergies as of 05/20/2020   No Known Allergies     Medication List    TAKE these medications   acetaminophen 325 MG tablet Commonly known as: TYLENOL Take 650 mg by mouth every 6 (six) hours as needed.   alum & mag hydroxide-simeth 336-122-44 MG/5ML suspension Commonly known as: Maalox Max Take 15 mLs by mouth every 6 (six) hours as needed for indigestion.   atorvastatin 10 MG tablet Commonly known as: LIPITOR Take 10 mg by mouth at bedtime.   Junel FE 1.5/30 1.5-30 MG-MCG tablet Generic drug: norethindrone-ethinyl estradiol-iron Take 1 tablet by mouth daily.   metoCLOPramide 10 MG tablet Commonly known as: REGLAN Take 1 tablet (10 mg total) by mouth every 8 (eight) hours as needed for nausea.   traMADol 50 MG tablet Commonly known as: ULTRAM Take 1-2 tablets (50-100 mg total) by mouth every 6 (six) hours as needed.   Vitamin D (Ergocalciferol) 1.25 MG (50000 UNIT) Caps capsule Commonly known as: DRISDOL Take 50,000 Units by mouth once a week.            Discharge Care Instructions  (From admission, onward)         Start     Ordered   05/20/20 0000  No dressing needed        05/20/20 1004          Follow-up Information    Armandina Gemma, MD. Schedule an appointment as soon as possible for a visit in 3 weeks.   Specialty: General Surgery Contact information: [address] [place] [address] [phone]               Armandina Gemma, [facility] Office:  [phone]   Signed: Armandina Gemma 05/20/2020, 10:04 AM

## 2020-05-23 DIAGNOSIS — Z419 Encounter for procedure for purposes other than remedying health state, unspecified: Secondary | ICD-10-CM | POA: Diagnosis not present

## 2020-06-22 DIAGNOSIS — Z419 Encounter for procedure for purposes other than remedying health state, unspecified: Secondary | ICD-10-CM | POA: Diagnosis not present

## 2020-07-02 ENCOUNTER — Ambulatory Visit: Payer: BC Managed Care – PPO | Admitting: Gastroenterology

## 2020-07-23 DIAGNOSIS — Z419 Encounter for procedure for purposes other than remedying health state, unspecified: Secondary | ICD-10-CM | POA: Diagnosis not present

## 2020-08-22 DIAGNOSIS — Z419 Encounter for procedure for purposes other than remedying health state, unspecified: Secondary | ICD-10-CM | POA: Diagnosis not present

## 2020-09-22 DIAGNOSIS — Z419 Encounter for procedure for purposes other than remedying health state, unspecified: Secondary | ICD-10-CM | POA: Diagnosis not present

## 2020-10-23 DIAGNOSIS — Z419 Encounter for procedure for purposes other than remedying health state, unspecified: Secondary | ICD-10-CM | POA: Diagnosis not present

## 2020-11-22 DIAGNOSIS — Z419 Encounter for procedure for purposes other than remedying health state, unspecified: Secondary | ICD-10-CM | POA: Diagnosis not present

## 2020-12-23 DIAGNOSIS — Z419 Encounter for procedure for purposes other than remedying health state, unspecified: Secondary | ICD-10-CM | POA: Diagnosis not present

## 2021-01-22 DIAGNOSIS — Z419 Encounter for procedure for purposes other than remedying health state, unspecified: Secondary | ICD-10-CM | POA: Diagnosis not present

## 2021-02-22 DIAGNOSIS — Z419 Encounter for procedure for purposes other than remedying health state, unspecified: Secondary | ICD-10-CM | POA: Diagnosis not present

## 2021-03-25 DIAGNOSIS — Z419 Encounter for procedure for purposes other than remedying health state, unspecified: Secondary | ICD-10-CM | POA: Diagnosis not present

## 2021-04-22 DIAGNOSIS — Z419 Encounter for procedure for purposes other than remedying health state, unspecified: Secondary | ICD-10-CM | POA: Diagnosis not present

## 2021-05-23 DIAGNOSIS — Z419 Encounter for procedure for purposes other than remedying health state, unspecified: Secondary | ICD-10-CM | POA: Diagnosis not present

## 2021-06-01 IMAGING — US US ABDOMEN COMPLETE
1 series · 14 of 25 positions shown · non-contrast
Comparison: None.

CLINICAL DATA: Nausea.

EXAM:
ABDOMEN ULTRASOUND COMPLETE

[Series 1: us abdomen complete · 0.15mm/px · 14 of 96 slices shown]
[im 1/96]
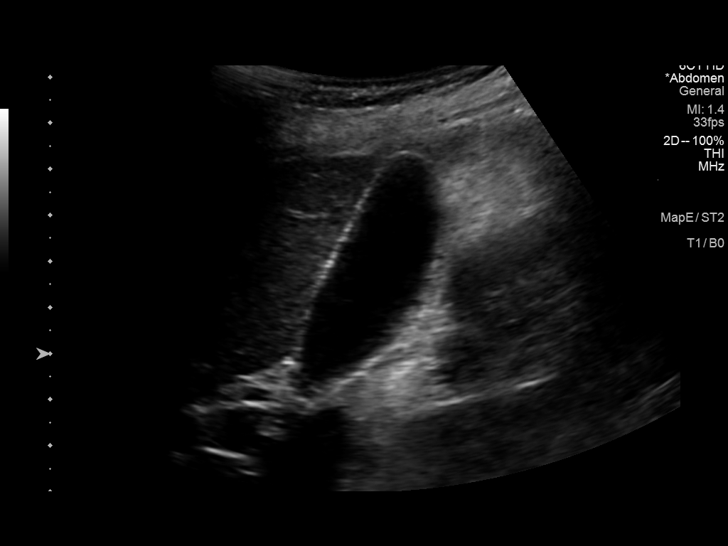
[im 8/96]
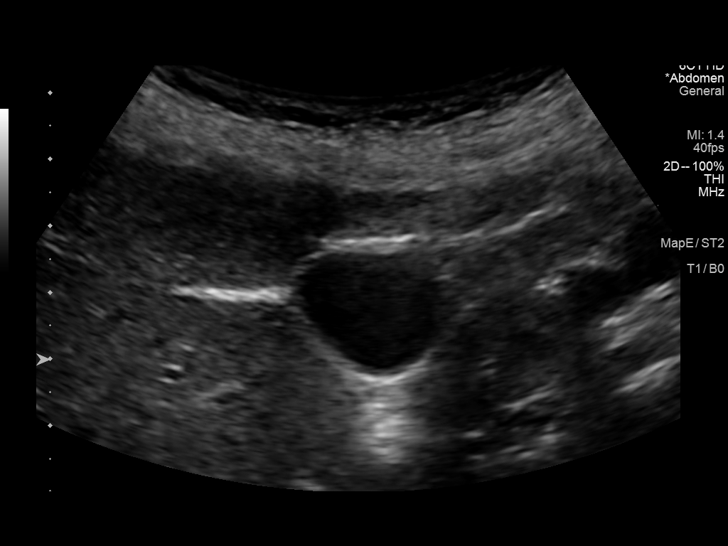
[im 16/96]
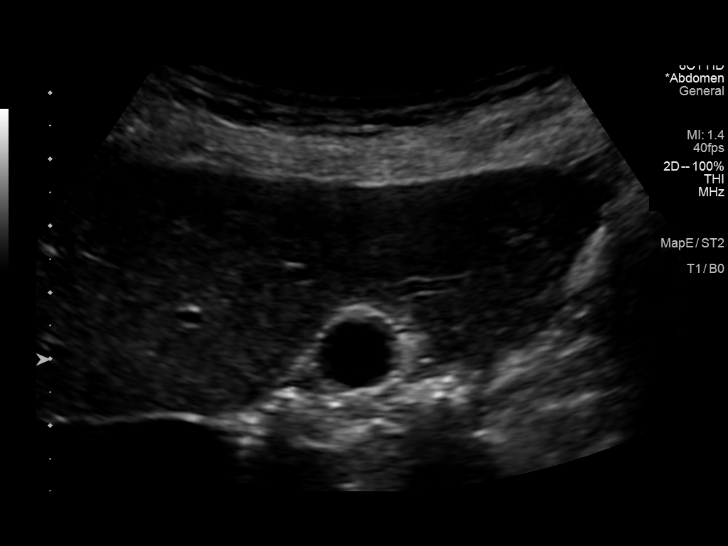
[im 24/96]
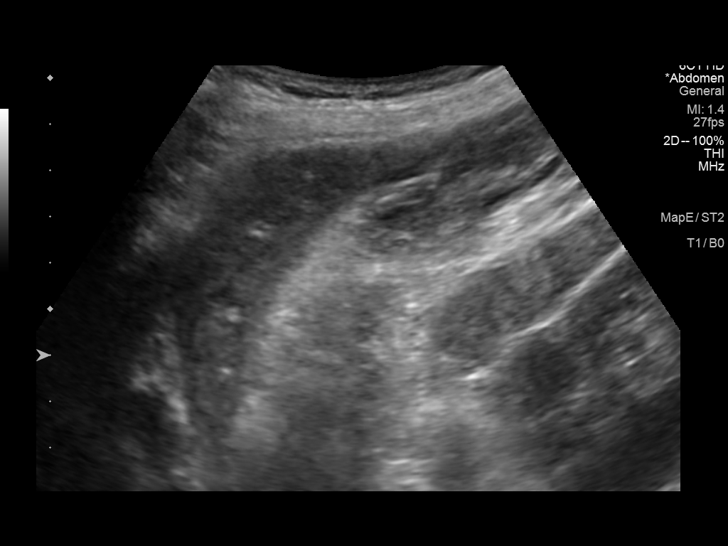
[im 32/96]
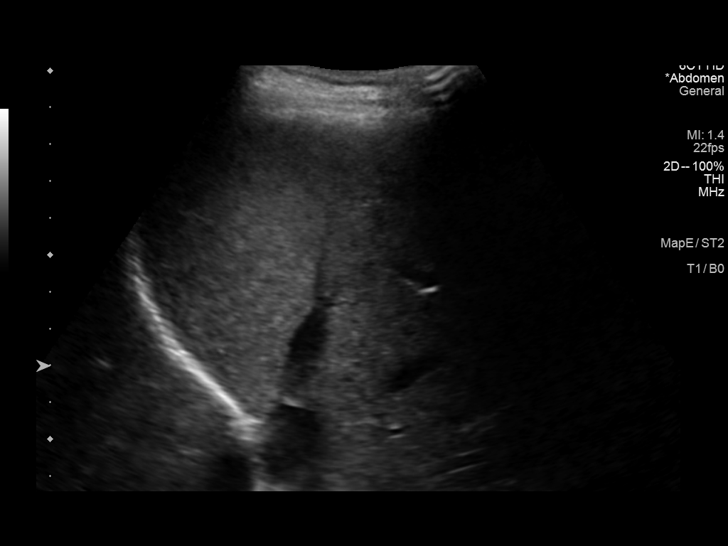
[im 36/96]
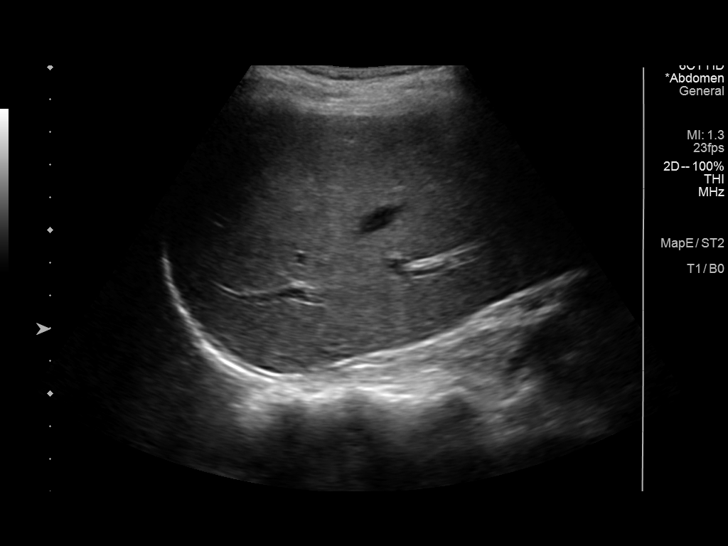
[im 44/96]
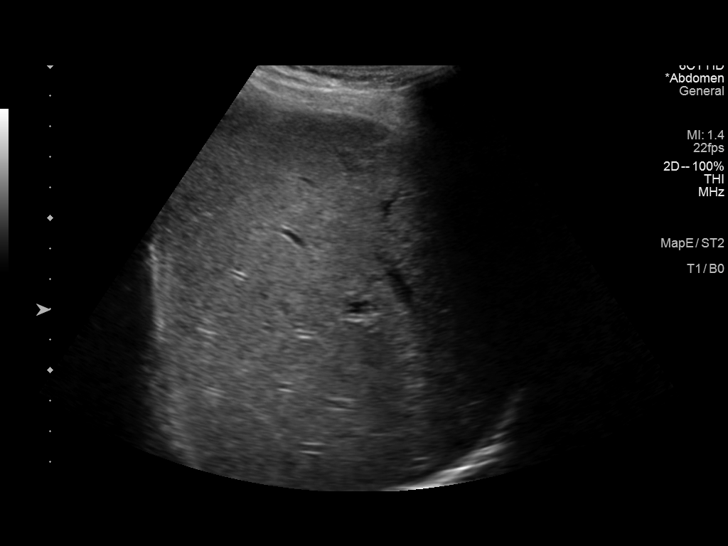
[im 52/96]
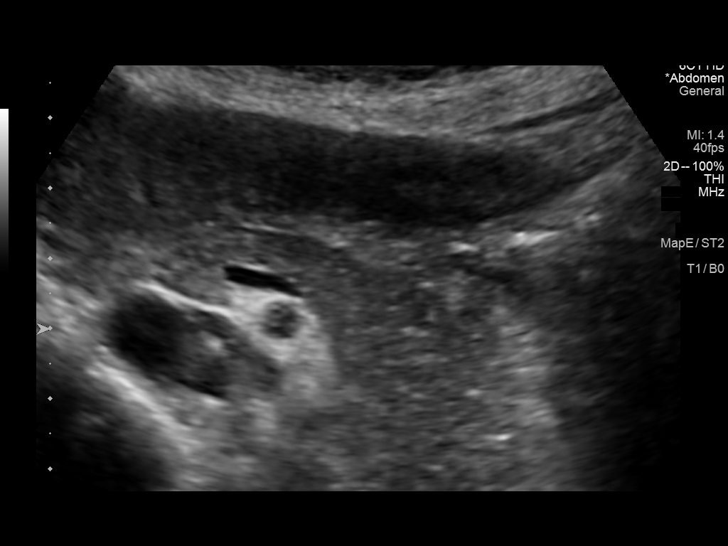
[im 60/96]
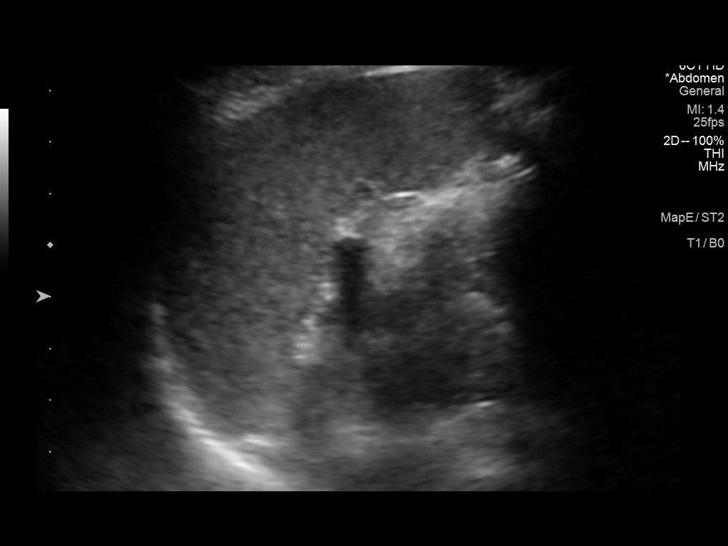
[im 64/96]
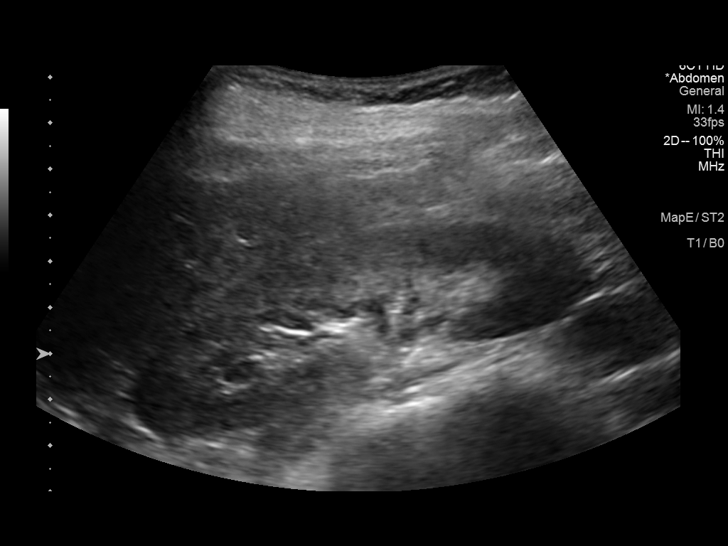
[im 72/96]
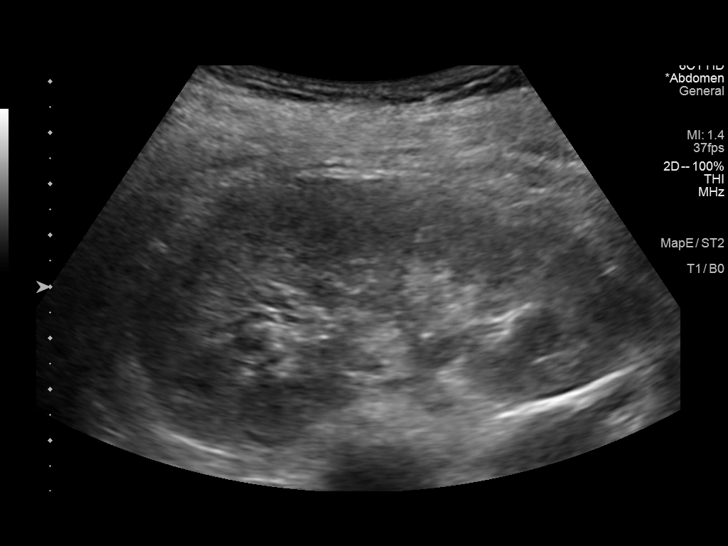
[im 80/96]
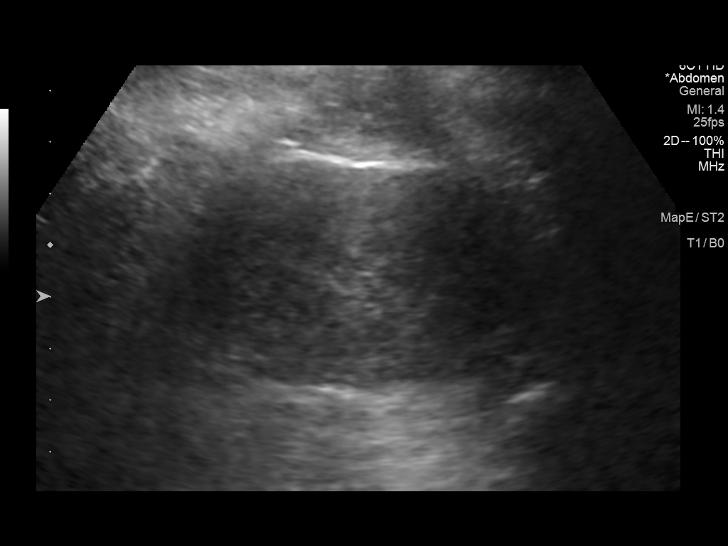
[im 88/96]
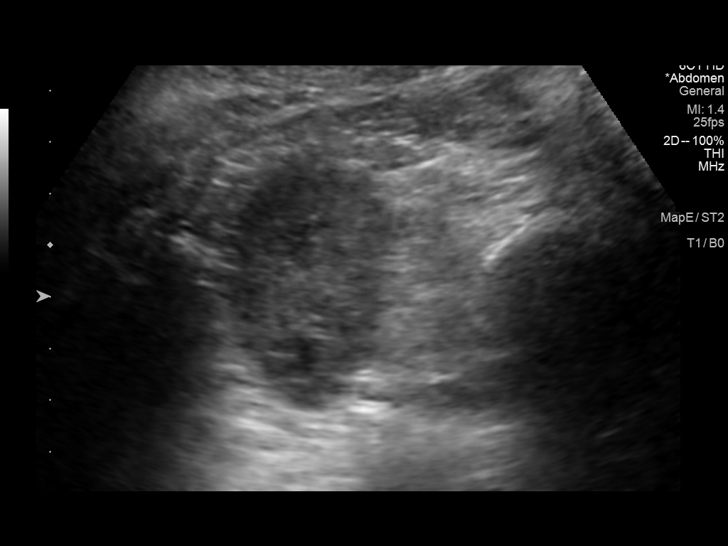
[im 96/96]
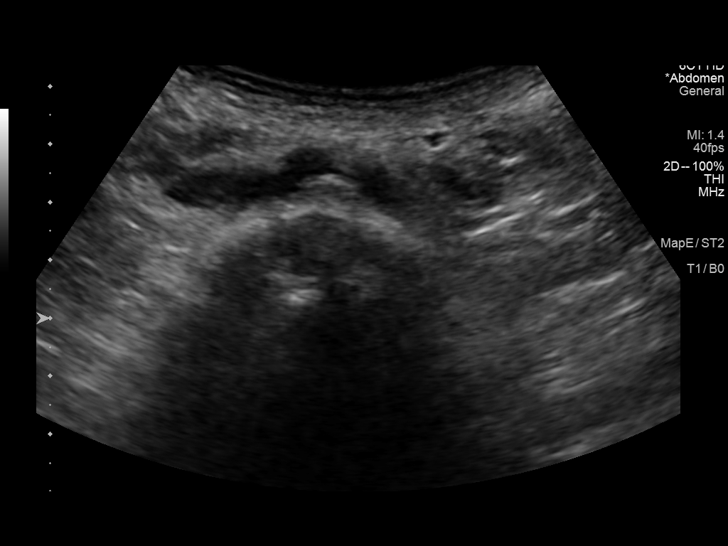

[14 of 25 positions shown; findings below may reference images not displayed]

FINDINGS: Gallbladder: No gallstones or wall thickening visualized. No
sonographic Murphy sign noted by sonographer.

Common bile duct: Diameter: 3 mm which is within normal limits.

Liver: No focal lesion identified. Within normal limits in
parenchymal echogenicity. Portal vein is patent on color Doppler
imaging with normal direction of blood flow towards the liver.

IVC: No abnormality visualized.

Pancreas: Visualized portion unremarkable.

Spleen: Size and appearance within normal limits.

Right Kidney: Length: 10.2 cm. Echogenicity within normal limits. No
mass or hydronephrosis visualized.

Left Kidney: Length: 9.7 cm. Echogenicity within normal limits. No
mass or hydronephrosis visualized.

Abdominal aorta: No aneurysm visualized.

Other findings: None.
IMPRESSION: No abnormality seen in the abdomen.

## 2021-06-22 DIAGNOSIS — Z419 Encounter for procedure for purposes other than remedying health state, unspecified: Secondary | ICD-10-CM | POA: Diagnosis not present

## 2021-07-23 DIAGNOSIS — Z419 Encounter for procedure for purposes other than remedying health state, unspecified: Secondary | ICD-10-CM | POA: Diagnosis not present

## 2021-08-22 DIAGNOSIS — Z419 Encounter for procedure for purposes other than remedying health state, unspecified: Secondary | ICD-10-CM | POA: Diagnosis not present

## 2021-08-26 DIAGNOSIS — R5381 Other malaise: Secondary | ICD-10-CM | POA: Diagnosis not present

## 2021-09-22 DIAGNOSIS — Z419 Encounter for procedure for purposes other than remedying health state, unspecified: Secondary | ICD-10-CM | POA: Diagnosis not present

## 2021-10-23 DIAGNOSIS — Z419 Encounter for procedure for purposes other than remedying health state, unspecified: Secondary | ICD-10-CM | POA: Diagnosis not present

## 2021-11-22 DIAGNOSIS — Z419 Encounter for procedure for purposes other than remedying health state, unspecified: Secondary | ICD-10-CM | POA: Diagnosis not present

## 2021-12-09 DIAGNOSIS — E785 Hyperlipidemia, unspecified: Secondary | ICD-10-CM | POA: Diagnosis not present

## 2021-12-09 DIAGNOSIS — E559 Vitamin D deficiency, unspecified: Secondary | ICD-10-CM | POA: Diagnosis not present

## 2021-12-23 DIAGNOSIS — Z419 Encounter for procedure for purposes other than remedying health state, unspecified: Secondary | ICD-10-CM | POA: Diagnosis not present

## 2022-01-22 DIAGNOSIS — Z419 Encounter for procedure for purposes other than remedying health state, unspecified: Secondary | ICD-10-CM | POA: Diagnosis not present

## 2022-02-22 DIAGNOSIS — Z419 Encounter for procedure for purposes other than remedying health state, unspecified: Secondary | ICD-10-CM | POA: Diagnosis not present

## 2022-03-25 DIAGNOSIS — Z419 Encounter for procedure for purposes other than remedying health state, unspecified: Secondary | ICD-10-CM | POA: Diagnosis not present

## 2022-04-23 DIAGNOSIS — Z419 Encounter for procedure for purposes other than remedying health state, unspecified: Secondary | ICD-10-CM | POA: Diagnosis not present

## 2022-05-06 ENCOUNTER — Ambulatory Visit
Admission: RE | Admit: 2022-05-06 | Discharge: 2022-05-06 | Disposition: A | Discharge status: Home / Self Care | Payer: No Typology Code available for payment source | Source: Ambulatory Visit | Attending: Obstetrics and Gynecology | Admitting: Obstetrics and Gynecology

## 2022-05-06 ENCOUNTER — Other Ambulatory Visit: Payer: Self-pay | Admitting: Obstetrics and Gynecology

## 2022-05-06 DIAGNOSIS — Z201 Contact with and (suspected) exposure to tuberculosis: Secondary | ICD-10-CM

## 2022-08-30 IMAGING — US US FNA BIOPSY THYROID 1ST LESION
1 series · 13 of 20 positions shown · non-contrast
Comparison: Ultrasound thyroid dated February 08, 2020

MEDICATIONS:
Lidocaine 1% 2 mL

COMPLICATIONS:
None immediate.

INDICATION: Indeterminate thyroid nodule

EXAM:
ULTRASOUND GUIDED FINE NEEDLE ASPIRATION OF INDETERMINATE THYROID
NODULE
TECHNIQUE: Using an interpreter at beside informed written consent was obtained
from the patient after a discussion of the risks, benefits and
alternatives to treatment. Questions regarding the procedure were
encouraged and answered. A timeout was performed prior to the
initiation of the procedure.

[Series 1: us fna biopsy thyroid 1st lesion · 0.06mm/px · 20 acquisitions, 13 frames shown]
[im 1/20]
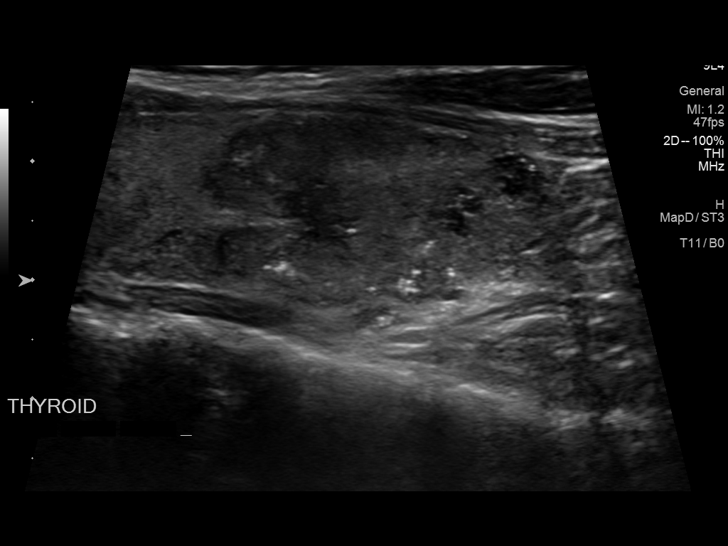
[im 3/20]
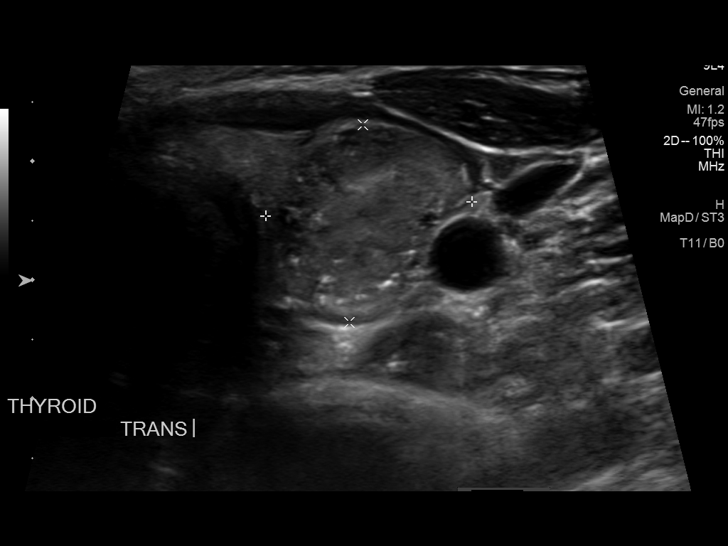
[im 4/20]
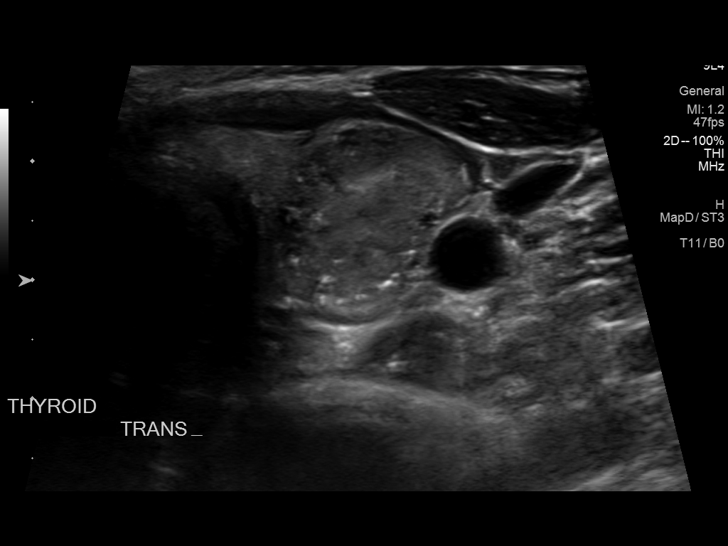
[im 6/20]
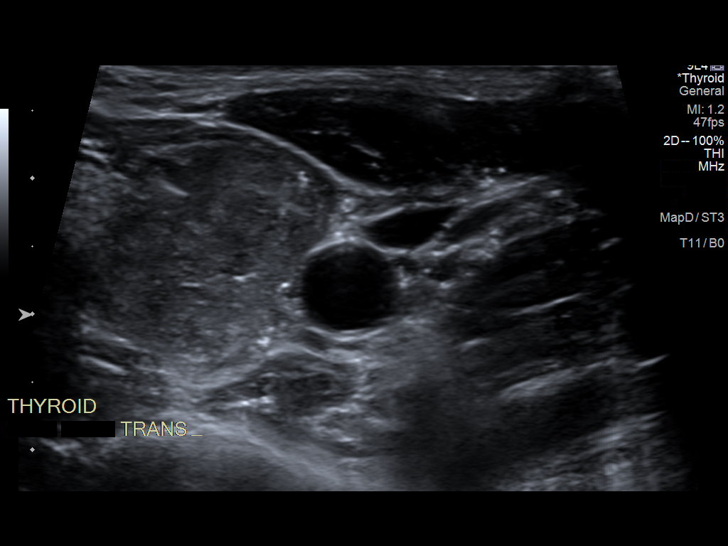
[im 7/20]
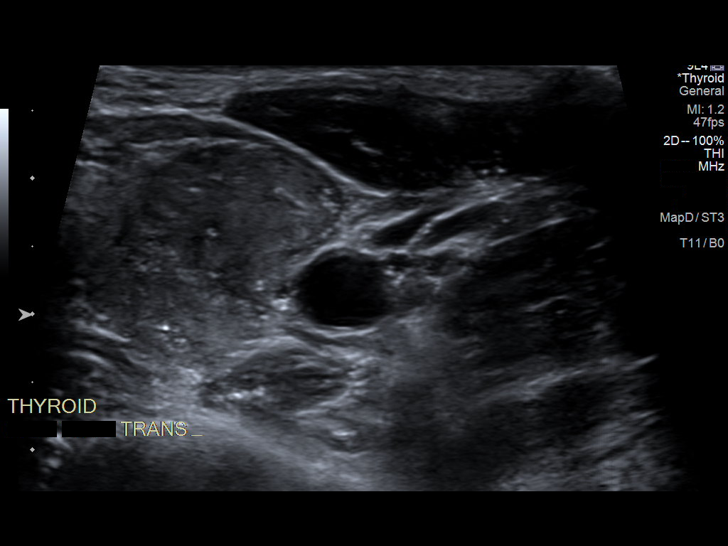
[im 9/20]
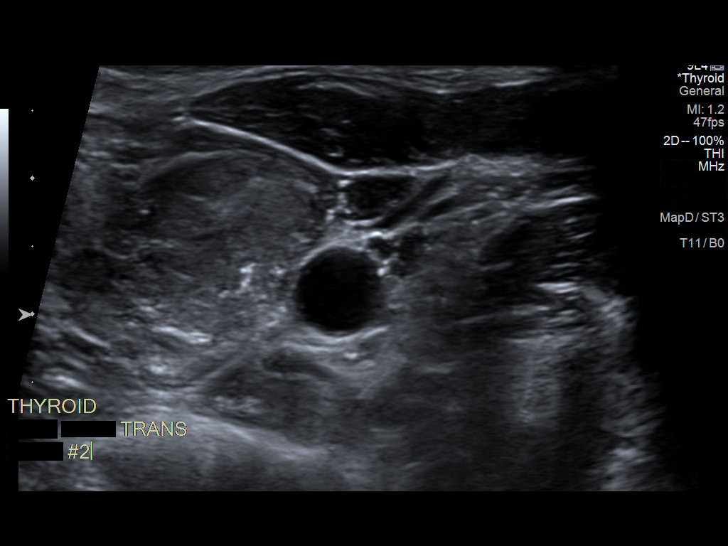
[im 11/20]
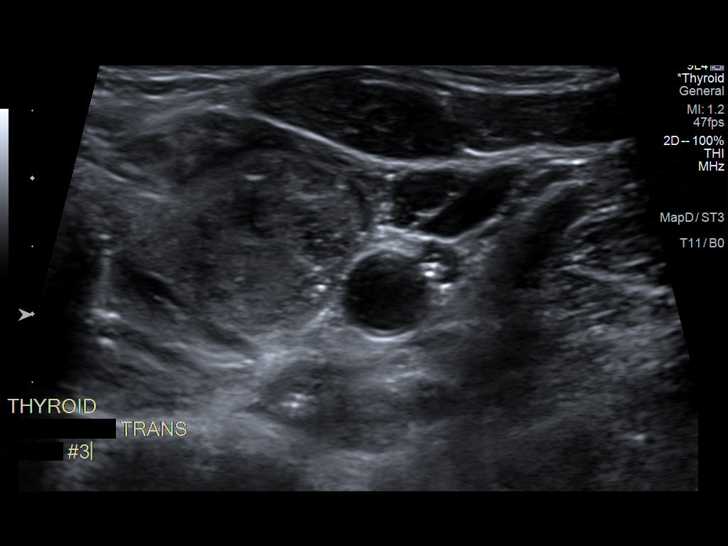
[im 12/20]
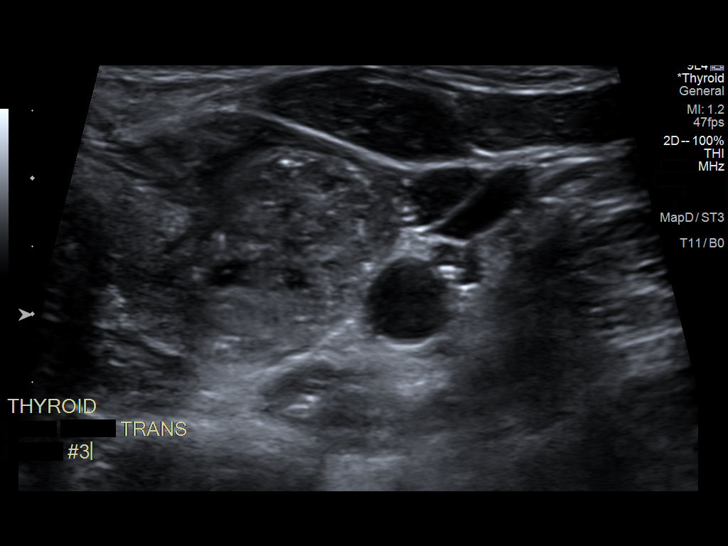
[im 14/20]
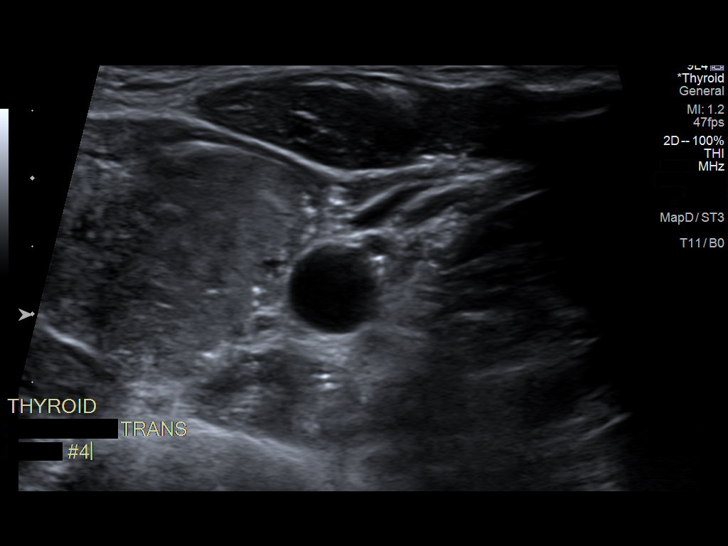
[im 15/20]
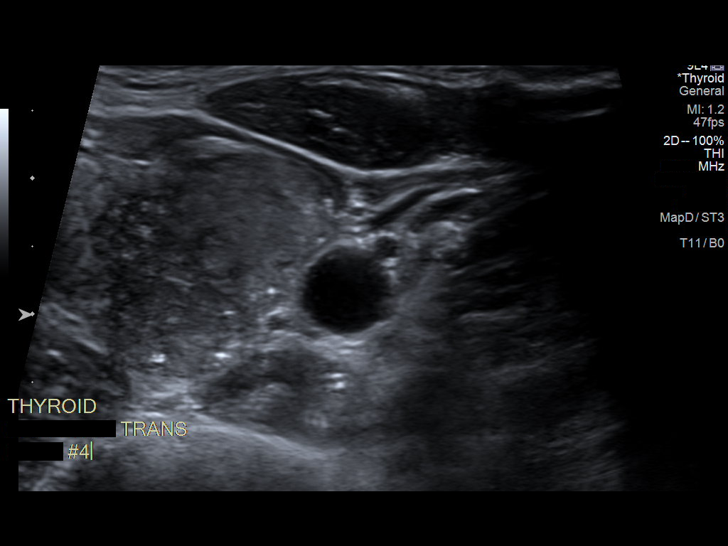
[im 17/20]
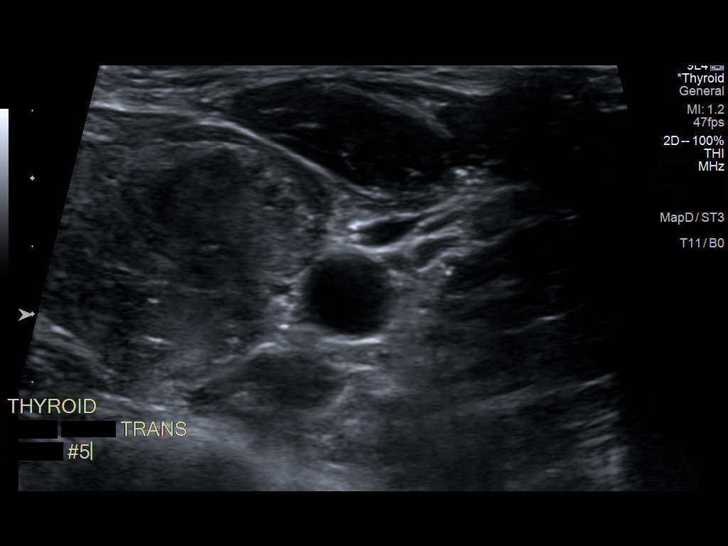
[im 18/20]
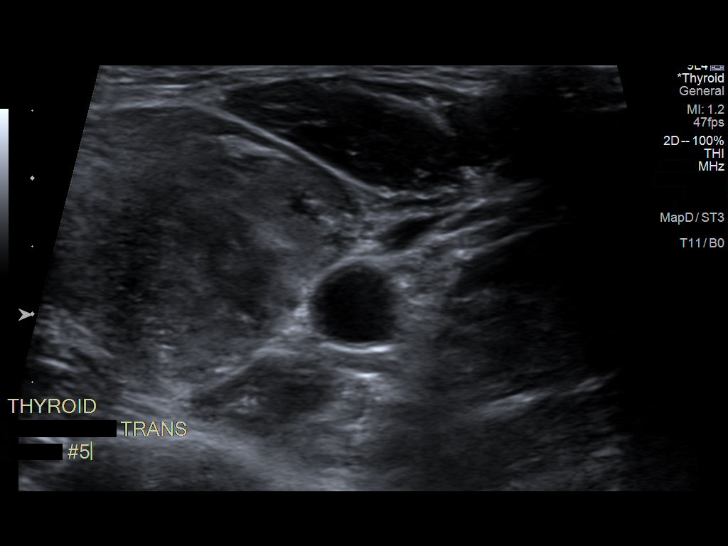
[im 20/20]
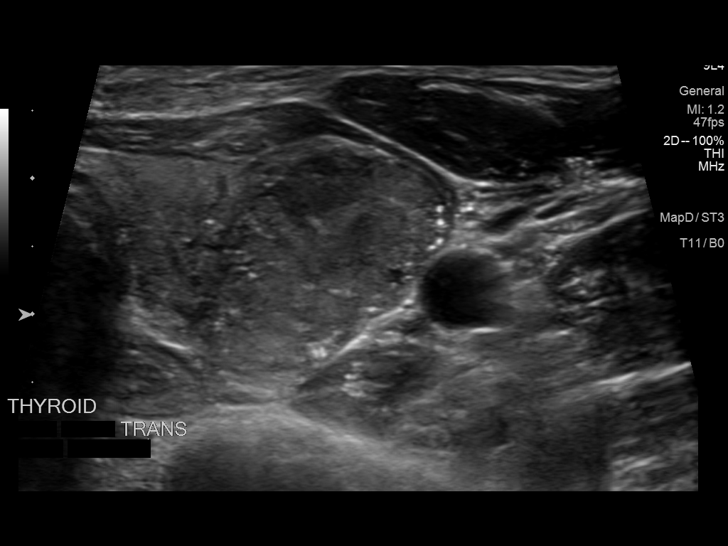

[13 of 20 positions shown; findings below may reference images not displayed]

Pre-procedural ultrasound scanning demonstrated unchanged size and
appearance of the indeterminate nodule within the left mid thyroid

The procedure was planned. The neck was prepped in the usual sterile
fashion, and a sterile drape was applied covering the operative
field. A timeout was performed prior to the initiation of the
procedure. Local anesthesia was provided with 1% lidocaine.

Under direct ultrasound guidance, 5 FNA biopsies were performed of
the left mid nodule with a 25 gauge needle.

Two samples were sent to [facility] per ordering ASTRIT LULJETA.

Multiple ultrasound images were saved for procedural documentation
purposes. The samples were prepared and submitted to pathology.

Limited post procedural scanning was negative for hematoma or
additional complication. Dressings were placed. The patient
tolerated the above procedures procedure well without immediate
postprocedural complication.
FINDINGS: Nodule reference number based on prior diagnostic ultrasound: 1

Maximum size: 2.9 cm

Location: Left; Mid

ACR TI-RADS risk category: TR5 (>/= 7 points)

Reason for biopsy: meets ACR TI-RADS criteria

Ultrasound imaging confirms appropriate placement of the needles
within the thyroid nodule.
IMPRESSION: Technically successful ultrasound guided fine needle aspiration of
left mid thyroid nodule

Read by: Mladen Hr, NP

## 2023-12-14 ENCOUNTER — Other Ambulatory Visit: Payer: Self-pay | Admitting: General Surgery

## 2023-12-14 DIAGNOSIS — M6208 Separation of muscle (nontraumatic), other site: Secondary | ICD-10-CM
